# Patient Record
Sex: Male | Born: 1965 | Race: Black or African American | Hispanic: No | Marital: Single | Smoking: Never smoker
Health system: Southern US, Community
[De-identification: ages and names within clinical notes are randomized; demographics above are authoritative.]

## PROBLEM LIST (undated history)

## (undated) DIAGNOSIS — I10 Essential (primary) hypertension: Secondary | ICD-10-CM

## (undated) DIAGNOSIS — F419 Anxiety disorder, unspecified: Secondary | ICD-10-CM

## (undated) HISTORY — DX: Essential (primary) hypertension: I10

## (undated) HISTORY — PX: WRIST SURGERY: SHX841

---

## 2014-10-20 ENCOUNTER — Encounter (HOSPITAL_COMMUNITY): Payer: Self-pay | Admitting: Emergency Medicine

## 2014-10-20 ENCOUNTER — Emergency Department (HOSPITAL_COMMUNITY)
Admission: EM | Admit: 2014-10-20 | Discharge: 2014-10-20 | Disposition: A | Discharge status: Home / Self Care | Payer: Self-pay | Attending: Emergency Medicine | Admitting: Emergency Medicine

## 2014-10-20 DIAGNOSIS — Z760 Encounter for issue of repeat prescription: Secondary | ICD-10-CM | POA: Insufficient documentation

## 2014-10-20 DIAGNOSIS — F419 Anxiety disorder, unspecified: Secondary | ICD-10-CM | POA: Insufficient documentation

## 2014-10-20 HISTORY — DX: Anxiety disorder, unspecified: F41.9

## 2014-10-20 MED ORDER — CLONAZEPAM 0.5 MG PO TABS: 0.5000 mg | ORAL_TABLET | Freq: Two times a day (BID) | ORAL | Status: DC | PRN

## 2014-10-20 NOTE — Discharge Instructions (Signed)
Panic Attacks Panic attacks are sudden, short feelings of great fear or discomfort. You may have them for no reason when you are relaxed, when you are uneasy (anxious), or when you are sleeping.  HOME CARE  Take all your medicines as told.  Check with your doctor before starting new medicines.  Keep all doctor visits. GET HELP IF:  You are not able to take your medicines as told.  Your symptoms do not get better.  Your symptoms get worse. GET HELP RIGHT AWAY IF:  Your attacks seem different than your normal attacks.  You have thoughts about hurting yourself or others.  You take panic attack medicine and you have a side effect. MAKE SURE YOU:  Understand these instructions.  Will watch your condition.  Will get help right away if you are not doing well or get worse. Document Released: 08/13/2010 Document Revised: 05/01/2013 Document Reviewed: 02/22/2013 [facility] Patient Information 2015 [place]. This information is not intended to replace advice given to you by your health care provider. Make sure you discuss any questions you have with your health care provider.  Social Anxiety Disorder Social anxiety disorder, previously called social phobia, is a mental disorder. People with social anxiety disorder frequently feel nervous, afraid, or embarrassed when around other people in social situations. They constantly worry that other people are judging or criticizing them for how they look, what they say, or how they act. They may worry that other people might reject them because of their appearance or behavior. Social anxiety disorder is more than just occasional shyness or self-consciousness. It can cause severe emotional distress. It can interfere with daily life activities. Social anxiety disorder also may lead to excessive alcohol or drug use and even suicide.  Social anxiety disorder is actually one of the most common mental disorders. It can develop at any time but usually  starts in the teenage years. Women are more commonly affected than men. Social anxiety disorder is also more common in people who have family members with anxiety disorders. It also is more common in people who have physical deformities or conditions with characteristics that are obvious to others, such as stuttered speech or movement abnormalities (Parkinson disease).  SYMPTOMS  In addition to feeling anxious or fearful in social situations, people with social anxiety disorder frequently have physical symptoms. Examples include:  Red face (blushing).  Racing heart.  Sweating.  Shaky hands or voice.  Confusion.  Light-headedness.  Upset stomach and diarrhea. DIAGNOSIS  Social anxiety disorder is diagnosed through an assessment by your health care provider. Your health care provider will ask you questions about your mood, thoughts, and reactions in social situations. Your health care provider may ask you about your medical history and use of alcohol or drugs, including prescription medicines. Certain medical conditions and the use of certain substances, including caffeine, can cause symptoms similar to social anxiety disorder. Your health care provider may refer you to a mental health specialist for further evaluation or treatment. The criteria for diagnosis of social anxiety disorder are:  Marked fear or anxiety in one or more social situations in which you may be closely watched or studied by others. Examples of such situations include:  Interacting socially (having a conversation with others, going to a party, or meeting strangers).  Being observed (eating or drinking in public or being called on in class).  Performing in front of others (giving a speech).  The social situations of concern almost always cause fear or anxiety, not  just occasionally.  People with social anxiety disorder fear that they will be viewed negatively in a way that will be embarrassing, will lead to rejection,  or will offend others. This fear is out of proportion to the actual threat posed by the social situation.  Often the triggering social situations are avoided, or they are endured with intense fear or anxiety. The fear, anxiety, or avoidance is persistent and lasts for 6 months or longer.  The anxiety causes difficulty functioning in at least some parts of your daily life. TREATMENT  Several types of treatment are available for social anxiety disorder. These treatments are often used in combination and include:   Talk therapy. Group talk therapy allows you to see that you are not alone with these problems. Individual talk therapy helps you address your specific anxiety issues with a caring professional. The most effective forms of talk therapy for social anxiety disorder are cognitive-behavioral therapy and exposure therapy. Cognitive-behavioral therapy helps you to identify and change negative thoughts and beliefs that are at the root of the disorder. Exposure therapy allows you to gradually face the situations that you fear most.  Relaxation and coping techniques. These include deep breathing, self-talk, meditation, visual imagery, and yoga. Relaxation techniques help to keep you calm in social situations.  Social Optician, dispensing.Social skills can be learned on your own or with the help of a talk therapist. They can help you feel more confident and comfortable in social situations.  Medicine. For anxiety limited to performance situations (performance anxiety), medicine called beta blockers can help by reducing or preventing the physical symptoms of social anxiety disorder. For more persistent and generalized social anxiety, antidepressant medicine may be prescribed to help control symptoms. In severe cases of social anxiety disorder, strong antianxiety medicine, called benzodiazepines, may be prescribed on a limited basis and for a short time. Document Released: 06/09/2005 Document Revised:  11/25/2013 Document Reviewed: 10/09/2012 [facility] Patient Information 2015 [place], [place]. This information is not intended to replace advice given to you by your health care provider. Make sure you discuss any questions you have with your health care provider.

## 2014-10-20 NOTE — ED Notes (Addendum)
Pt states he has been out of medication clonazepam x 3 days and needs a refill, [facility], needs resources for PCP. Pt states he has anxiety issues.

## 2014-10-20 NOTE — ED Provider Notes (Signed)
CSN: [id number]     Arrival date & time 10/20/14  1207 History  This chart was scribed for Junius Finner, PA-C, working with Bethann Berkshire, MD by Jolene Provost, ED Scribe. This patient was seen in room WTR6/WTR6 and the patient's care was started at 12:57 PM.     Chief Complaint  Patient presents with  . Anxiety  . Medication Refill   Patient is a 49 y.o. male presenting with anxiety. The history is provided by the patient. No language interpreter was used.  Anxiety Pertinent negatives include no chest pain and no shortness of breath.   HPI Comments: Hayden Morris is a 49 y.o. male who presents to the Emergency Department complaining of anxiety. Pt states he has been out of his clonazepam medication for the last three days and has felt jittery, and moody and he believes it is because he has been off his medication. Pt states he has been on clonazepam for multiple years. Pt states he moved from [place] to [place] about 1 week ago and does not have a PCP. Pt denies a past hx of DM. Pt states he has been on blood pressure medication in the past but was taken off medication. Denies chest pain or SOB. Denies abdominal pain, n/v/d.    Past Medical History  Diagnosis Date  . Anxiety    Past Surgical History  Procedure Laterality Date  . Wrist surgery Left    No family history on file. History  Substance Use Topics  . Smoking status: Never Smoker   . Smokeless tobacco: Not on file  . Alcohol Use: No    Review of Systems  Constitutional: Negative for fever and chills.  Respiratory: Negative for shortness of breath.   Cardiovascular: Negative for chest pain and palpitations.  Gastrointestinal: Negative for nausea and vomiting.  Psychiatric/Behavioral: Negative for suicidal ideas and self-injury. The patient is nervous/anxious.     Allergies  Review of patient's allergies indicates no known allergies.  Home Medications   Prior to Admission medications   Medication Sig  Start Date End Date Taking? Authorizing Provider  clonazePAM (KLONOPIN) 0.5 MG tablet Take 1 tablet (0.5 mg total) by mouth 2 (two) times daily as needed for anxiety. 10/20/14   Junius Finner, PA-C   BP 140/105 mmHg  Pulse 82  Temp(Src) 98.7 F (37.1 C) (Oral)  Resp 18  SpO2 97% Physical Exam  Constitutional: He is oriented to person, place, and time. He appears well-developed and well-nourished. No distress.  HENT:  Head: Normocephalic and atraumatic.  Eyes: Pupils are equal, round, and reactive to light.  Neck: Neck supple.  Cardiovascular: Normal rate.   Pulmonary/Chest: Effort normal. No respiratory distress.  Musculoskeletal: Normal range of motion.  Neurological: He is alert and oriented to person, place, and time. Coordination normal.  Skin: Skin is warm and dry. He is not diaphoretic.  Psychiatric: He has a normal mood and affect. His behavior is normal.  Nursing note and vitals reviewed.   ED Course  Procedures  DIAGNOSTIC STUDIES: Oxygen Saturation is 97% on RA, normal by my interpretation.    COORDINATION OF CARE: 1:02 PM Discussed treatment plan with pt at bedside and pt agreed to plan.  Labs Review Labs Reviewed - No data to display  Imaging Review No results found.   EKG Interpretation None      MDM   Final diagnoses:  Anxiety  Medication refill    Pt presenting to ED with reports of anxiety, requesting medication refill. Pt  does have elevated BP but reports hx of HTN in the past.  Discussed establishing care with a PCP. [facility] as well as [facility]in [facility] info.  Refill for 2 weeks provided. Return precautions provided. Pt verbalized understanding and agreement with tx plan.   I personally performed the services described in this documentation, which was scribed in my presence. The recorded information has been reviewed and is accurate.   Junius Finnerrin O'Malley, PA-C 10/20/14 1311  Bethann BerkshireJoseph Zammit, MD 10/21/14 [phone]

## 2014-10-24 ENCOUNTER — Ambulatory Visit: Payer: Self-pay | Attending: Family Medicine | Admitting: Family Medicine

## 2014-10-24 VITALS — BP 145/99 | HR 94 | Temp 98.4°F | Resp 16 | Ht 74.0 in | Wt 190.0 lb

## 2014-10-24 DIAGNOSIS — I10 Essential (primary) hypertension: Secondary | ICD-10-CM | POA: Insufficient documentation

## 2014-10-24 DIAGNOSIS — F419 Anxiety disorder, unspecified: Secondary | ICD-10-CM | POA: Insufficient documentation

## 2014-10-24 DIAGNOSIS — Z139 Encounter for screening, unspecified: Secondary | ICD-10-CM

## 2014-10-24 LAB — CBC WITH DIFFERENTIAL/PLATELET
Basophils Absolute: 0.1 10*3/uL (ref 0.0–0.1)
Basophils Relative: 1 % (ref 0–1)
Eosinophils Absolute: 0.2 10*3/uL (ref 0.0–0.7)
Eosinophils Relative: 3 % (ref 0–5)
HCT: 44.9 % (ref 39.0–52.0)
Hemoglobin: 15.4 g/dL (ref 13.0–17.0)
LYMPHS ABS: 2 10*3/uL (ref 0.7–4.0)
LYMPHS PCT: 33 % (ref 12–46)
MCH: 30 pg (ref 26.0–34.0)
MCHC: 34.3 g/dL (ref 30.0–36.0)
MCV: 87.4 fL (ref 78.0–100.0)
MONO ABS: 0.6 10*3/uL (ref 0.1–1.0)
MONOS PCT: 10 % (ref 3–12)
MPV: 10.2 fL (ref 8.6–12.4)
NEUTROS ABS: 3.2 10*3/uL (ref 1.7–7.7)
NEUTROS PCT: 53 % (ref 43–77)
Platelets: 309 10*3/uL (ref 150–400)
RBC: 5.14 MIL/uL (ref 4.22–5.81)
RDW: 13.9 % (ref 11.5–15.5)
WBC: 6.1 10*3/uL (ref 4.0–10.5)

## 2014-10-24 LAB — COMPLETE METABOLIC PANEL WITH GFR
ALK PHOS: 84 U/L (ref 39–117)
ALT: 18 U/L (ref 0–53)
AST: 17 U/L (ref 0–37)
Albumin: 4.3 g/dL (ref 3.5–5.2)
BUN: 11 mg/dL (ref 6–23)
CO2: 30 meq/L (ref 19–32)
CREATININE: 1.07 mg/dL (ref 0.50–1.35)
Calcium: 10.1 mg/dL (ref 8.4–10.5)
Chloride: 105 mEq/L (ref 96–112)
GFR, Est Non African American: 81 mL/min
GLUCOSE: 85 mg/dL (ref 70–99)
Potassium: 4.6 mEq/L (ref 3.5–5.3)
SODIUM: 142 meq/L (ref 135–145)
Total Bilirubin: 0.4 mg/dL (ref 0.2–1.2)
Total Protein: 7.9 g/dL (ref 6.0–8.3)

## 2014-10-24 MED ORDER — LISINOPRIL 20 MG PO TABS: 20.0000 mg | ORAL_TABLET | Freq: Every day | ORAL | Status: DC

## 2014-10-24 MED ORDER — CLONAZEPAM 0.5 MG PO TABS: 0.5000 mg | ORAL_TABLET | Freq: Two times a day (BID) | ORAL | Status: DC | PRN

## 2014-10-24 NOTE — Progress Notes (Signed)
Patient ID: Hayden LynchJonathan Morris, male   DOB: 1965-09-10, 49 y.o.   MRN: [id number]  CC:    ED follow-up for refill of Klonipin .5 mg. He has a history of anxiety and panic attacks and has been on Klonipin for a number of years.  He also has a history of HTN but has not been on medication for a couple of years as his blood presssure was better.  He denies other past medical history but will arrange for us to get his medical records [place]  He was seen in ED a couple of days ago and was given a 10 day supply of Klonipin and directed to come here for ongoing health care.   ROS:  GEN:   Denies fever, chills,WT loss Skin:   Denies lesions or rashes HENT:   Denies  earache, epistaxis, sore throat, or    Admits: neck pain, headaches                LUNGS:  Denies SOB with rest or walking  (only with anxiery attacks.) CV:   Denies CP or palpitations ABD:   Denies abdominal pain, nausea,and vomiting            EXT:    Denies muscle spasms or swelling; no pain in lower ext, no weakness NEURO:   Denies numbness or tingling, denies seizures  Objective:  Filed Vitals:   10/24/14 0942  BP: 145/99  Pulse: 94  Temp: 98.4 F (36.9 C)  TempSrc: Oral  Resp: 16  Height: 6\' 2"  (1.88 m)  Weight: 190 lb (86.183 kg)  SpO2: 96%    Physical Exam:  General:  in no acute distress. HEENT:  Normocephalic, atraumatic, no pallor, no icterus, moist oral mucosa,  Neck:   Supple FROM w/o adenopathy, tenderness, or thyroidomegaly. Heart:   Normal  s1 &s2  Regular rate and rhythm, without M,G,R Lungs:   Clear to auscultation bilaterally. No increase in respiratory effort. Abdomen:  Soft, nontender, nondistended, positive bowel sounds. Exetremeties:  No pedal edema. Neuro:   Alert, awake, oriented x3, nonfocal.  Pertinent Lab Results:   Medications: Prior to Admission medications   Medication Sig Start Date End Date Taking? Authorizing Provider  clonazePAM (KLONOPIN) 0.5 MG tablet  Take 1 tablet (0.5 mg total) by mouth 2 (two) times daily as needed for anxiety. 10/20/14  Yes Junius FinnerErin O'Malley, PA-C    Assessment: 1. Anxiety 2. Hypertension 3. Need for PPD  Plan: 1. Refill of Klonipin  And follow-up with assigned PCP here in two weeks. 2. Start lisinopril 20 mg qd and do basic blood work. 3. PPD.    Follow up:  The patient was given clear instructions to go to ER or return to medical center if symptoms don't improve, worsen or new problems develop. The patient verbalized understanding.   This note has been created with Education officer, environmentalDragon speech recognition software and smart phrase technology. Any transcriptional errors are unintentional.   Henrietta HooverLinda C. Bernhardt, FNP,BC 10/24/2014, 9:46 AM

## 2014-10-24 NOTE — Progress Notes (Signed)
Pt comes in to establish care for Hx. Anxiety [facility]need [facility], med refill [facility] prescribed Klonopin 0.5 mg tab and instructed to come here to establish care VSS States he has 10 pills left

## 2014-10-24 NOTE — Patient Instructions (Signed)
1.  Start lisinopril for blood pressure 2.  Make an appointment with our financial people to determine what help is available. 3.  Return early Monday morning at 9:00 to have TB skin test read. 4.  Make an appointment with assigned primary doctor here for two weeks. 5.  Follow Dash diet to help control salt. DASH Eating Plan DASH stands for "Dietary Approaches to Stop Hypertension." The DASH eating plan is a healthy eating plan that has been shown to reduce high blood pressure (hypertension). Additional health benefits may include reducing the risk of type 2 diabetes mellitus, heart disease, and stroke. The DASH eating plan may also help with weight loss. WHAT DO I NEED TO KNOW ABOUT THE DASH EATING PLAN? For the DASH eating plan, you will follow these general guidelines:  Choose foods with a percent daily value for sodium of less than 5% (as listed on the food label).  Use salt-free seasonings or herbs instead of table salt or sea salt.  Check with your health care provider or pharmacist before using salt substitutes.  Eat lower-sodium products, often labeled as "lower sodium" or "no salt added."  Eat fresh foods.  Eat more vegetables, fruits, and low-fat dairy products.  Choose whole grains. Look for the word "whole" as the first word in the ingredient list.  Choose fish and skinless chicken or [place] more often than red meat. Limit fish, poultry, and meat to 6 oz (170 g) each day.  Limit sweets, desserts, sugars, and sugary drinks.  Choose heart-healthy fats.  Limit cheese to 1 oz (28 g) per day.  Eat more home-cooked food and less restaurant, buffet, and fast food.  Limit fried foods.  Cook foods using methods other than frying.  Limit canned vegetables. If you do use them, rinse them well to decrease the sodium.  When eating at a restaurant, ask that your food be prepared with less salt, or no salt if possible. WHAT FOODS CAN I EAT? Seek help from a dietitian for  individual calorie needs. Grains Whole grain or whole wheat bread. Brown rice. Whole grain or whole wheat pasta. Quinoa, bulgur, and whole grain cereals. Low-sodium cereals. Corn or whole wheat flour tortillas. Whole grain cornbread. Whole grain crackers. Low-sodium crackers. Vegetables Fresh or frozen vegetables (raw, steamed, roasted, or grilled). Low-sodium or reduced-sodium tomato and vegetable juices. Low-sodium or reduced-sodium tomato sauce and paste. Low-sodium or reduced-sodium canned vegetables.  Fruits All fresh, canned (in natural juice), or frozen fruits. Meat and Other Protein Products Ground beef (85% or leaner), grass-fed beef, or beef trimmed of fat. Skinless chicken or [place]. Ground chicken or [place]. Pork trimmed of fat. All fish and seafood. Eggs. Dried beans, peas, or lentils. Unsalted nuts and seeds. Unsalted canned beans. Dairy Low-fat dairy products, such as skim or 1% milk, 2% or reduced-fat cheeses, low-fat ricotta or cottage cheese, or plain low-fat yogurt. Low-sodium or reduced-sodium cheeses. Fats and Oils Tub margarines without trans fats. Light or reduced-fat mayonnaise and salad dressings (reduced sodium). Avocado. Safflower, olive, or canola oils. Natural peanut or almond butter. Other Unsalted popcorn and pretzels. The items listed above may not be a complete list of recommended foods or beverages. Contact your dietitian for more options. WHAT FOODS ARE NOT RECOMMENDED? Grains White bread. White pasta. White rice. Refined cornbread. Bagels and croissants. Crackers that contain trans fat. Vegetables Creamed or fried vegetables. Vegetables in a cheese sauce. Regular canned vegetables. Regular canned tomato sauce and paste. Regular tomato and vegetable juices. Fruits Dried  fruits. Canned fruit in light or heavy syrup. Fruit juice. Meat and Other Protein Products Fatty cuts of meat. Ribs, chicken wings, bacon, sausage, bologna, salami, chitterlings, fatback, hot  dogs, bratwurst, and packaged luncheon meats. Salted nuts and seeds. Canned beans with salt. Dairy Whole or 2% milk, cream, half-and-half, and cream cheese. Whole-fat or sweetened yogurt. Full-fat cheeses or blue cheese. Nondairy creamers and whipped toppings. Processed cheese, cheese spreads, or cheese curds. Condiments Onion and garlic salt, seasoned salt, table salt, and sea salt. Canned and packaged gravies. Worcestershire sauce. Tartar sauce. Barbecue sauce. Teriyaki sauce. Soy sauce, including reduced sodium. Steak sauce. Fish sauce. Oyster sauce. Cocktail sauce. Horseradish. Ketchup and mustard. Meat flavorings and tenderizers. Bouillon cubes. Hot sauce. Tabasco sauce. Marinades. Taco seasonings. Relishes. Fats and Oils Butter, stick margarine, lard, shortening, ghee, and bacon fat. Coconut, palm kernel, or palm oils. Regular salad dressings. Other Pickles and olives. Salted popcorn and pretzels. The items listed above may not be a complete list of foods and beverages to avoid. Contact your dietitian for more information. WHERE CAN I FIND MORE INFORMATION? National Heart, Lung, and Blood Institute: [employer] Document Released: 06/30/2011 Document Revised: 11/25/2013 Document Reviewed: 05/15/2013 [facility] Patient Information 2015 [place], [place]. This information is not intended to replace advice given to you by your health care provider. Make sure you discuss any questions you have with your health care provider.

## 2014-10-27 ENCOUNTER — Telehealth: Payer: Self-pay | Admitting: *Deleted

## 2014-10-27 ENCOUNTER — Ambulatory Visit: Payer: Self-pay | Attending: Internal Medicine

## 2014-10-27 ENCOUNTER — Other Ambulatory Visit: Payer: Self-pay | Admitting: Family Medicine

## 2014-10-27 DIAGNOSIS — Z0000 Encounter for general adult medical examination without abnormal findings: Secondary | ICD-10-CM

## 2014-10-27 MED ORDER — AMLODIPINE BESYLATE 10 MG PO TABS: 10.0000 mg | ORAL_TABLET | Freq: Every day | ORAL | Status: DC

## 2014-10-27 NOTE — Progress Notes (Unsigned)
PT IS HERE FOR A PPD TEST.

## 2014-10-27 NOTE — Telephone Encounter (Signed)
Gave patient results that his labs were normal-patient appreciative and had no questions

## 2014-10-27 NOTE — Telephone Encounter (Signed)
Pt is aware of his lab results. 

## 2014-10-29 ENCOUNTER — Ambulatory Visit: Payer: Self-pay | Attending: Family Medicine

## 2014-10-29 LAB — TB SKIN TEST
Induration: 0 mm
TB SKIN TEST: NEGATIVE

## 2014-10-29 NOTE — Progress Notes (Unsigned)
Pt is here for a PPD reading. Test is negative.

## 2014-11-06 ENCOUNTER — Telehealth: Payer: Self-pay | Admitting: Internal Medicine

## 2014-11-06 NOTE — Telephone Encounter (Signed)
Pt will be coming in to fill out of medical records request form to be sent to [facility].  Their fax is currently down, please email MR release request to [email or web address] Attn: Medical Records.

## 2014-11-07 ENCOUNTER — Encounter: Payer: Self-pay | Admitting: Internal Medicine

## 2014-11-07 ENCOUNTER — Ambulatory Visit: Payer: Self-pay | Attending: Internal Medicine | Admitting: Internal Medicine

## 2014-11-07 VITALS — BP 122/82 | HR 68 | Temp 97.6°F | Resp 16 | Ht 74.0 in | Wt 193.0 lb

## 2014-11-07 DIAGNOSIS — I10 Essential (primary) hypertension: Secondary | ICD-10-CM | POA: Insufficient documentation

## 2014-11-07 DIAGNOSIS — F419 Anxiety disorder, unspecified: Secondary | ICD-10-CM | POA: Insufficient documentation

## 2014-11-07 MED ORDER — VALSARTAN 80 MG PO TABS: 80.0000 mg | ORAL_TABLET | Freq: Every day | ORAL | Status: AC

## 2014-11-07 NOTE — Progress Notes (Signed)
Patient ID: Hayden Morris, male   DOB: Mar 09, 1966, 49 y.o.   MRN: [id number]  BJY:[id number]  YQM:[id number]  DOB - [birth year]  CC:  Chief Complaint  Patient presents with  . Follow-up       HPI: Hayden Morris is a 49 y.o. male here today to establish medical care.  Patient presents to clinic today with a history of hypertension that was diagnosed recently. He was seen by Hayden Living, NP two weeks ago and was started on Lisinopril but reports that the medication gave him headaches and SOB. A few days later he reports that he was switched to Amlodipine. He now reports that everyday he takes the amlodipine he gets nauseous. He states that he takes it early in the morning with a meal. He reports that in the past he was on Diovan and he did very well with that medication. He would like to be switched to Diovan.  Patient has No headache, No chest pain, No abdominal pain - No Nausea, No new weakness tingling or numbness, No Cough - SOB.  No Known Allergies Past Medical History  Diagnosis Date  . Anxiety   . Hypertension    Current Outpatient Prescriptions on File Prior to Visit  Medication Sig Dispense Refill  . amLODipine (NORVASC) 10 MG tablet Take 1 tablet (10 mg total) by mouth daily. 90 tablet 3  . clonazePAM (KLONOPIN) 0.5 MG tablet Take 1 tablet (0.5 mg total) by mouth 2 (two) times daily as needed for anxiety. 28 tablet 0   No current facility-administered medications on file prior to visit.   History reviewed. No pertinent family history. History   Social History  . Marital Status: Single    Spouse Name: N/A  . Number of Children: N/A  . Years of Education: N/A   Occupational History  . Not on file.   Social History Main Topics  . Smoking status: Never Smoker   . Smokeless tobacco: Not on file  . Alcohol Use: No  . Drug Use: Not on file  . Sexual Activity: Not on file   Other Topics Concern  . Not on file   Social History Narrative    Review of  Systems: Constitutional: Negative for fever, chills, diaphoresis, activity change, appetite change and fatigue. HENT: Negative for ear pain, nosebleeds, congestion, facial swelling, rhinorrhea, neck pain, neck stiffness and ear discharge.  Eyes: Negative for pain, discharge, redness, itching and visual disturbance. Respiratory: Negative for cough, choking, chest tightness, shortness of breath, wheezing and stridor.  Cardiovascular: Negative for chest pain, palpitations and leg swelling. Gastrointestinal: Negative for abdominal distention. Genitourinary: Negative for dysuria, urgency, frequency, hematuria, flank pain, decreased urine volume, difficulty urinating and dyspareunia.  Musculoskeletal: Negative for back pain, joint swelling, arthralgia and gait problem. Neurological: Negative for dizziness, tremors, seizures, syncope, facial asymmetry, speech difficulty, weakness, light-headedness, numbness and headaches.  Hematological: Negative for adenopathy. Does not bruise/bleed easily. Psychiatric/Behavioral: Negative for hallucinations, behavioral problems, confusion, dysphoric mood, decreased concentration and agitation. + anxiety   Objective:   Filed Vitals:   11/07/14 1033  BP: 127/91  Pulse: 68  Temp: 97.6 F (36.4 C)  Resp: 16    Physical Exam  Constitutional: He is oriented to person, place, and time.  Cardiovascular: Normal rate, regular rhythm and normal heart sounds.   Pulmonary/Chest: Effort normal and breath sounds normal.  Musculoskeletal: He exhibits no edema.  Neurological: He is alert and oriented to person, place, and time.  Skin: Skin is warm and dry.  Lab Results  Component Value Date   WBC 6.1 10/24/2014   HGB 15.4 10/24/2014   HCT 44.9 10/24/2014   MCV 87.4 10/24/2014   PLT 309 10/24/2014   Lab Results  Component Value Date   CREATININE 1.07 10/24/2014   BUN 11 10/24/2014   NA 142 10/24/2014   K 4.6 10/24/2014   CL 105 10/24/2014   CO2 30  10/24/2014    No results found for: HGBA1C Lipid Panel  No results found for: CHOL, TRIG, HDL, CHOLHDL, VLDL, LDLCALC     Assessment and plan:   Hayden Morris was seen today for follow-up.  Diagnoses and all orders for this visit:  Essential hypertension Orders: -     valsartan (DIOVAN) 80 MG tablet; Take 1 tablet (80 mg total) by mouth daily. Will discontinue patient off amlodipine and switch to valsartan. Will bring patient back in 2 weeks to recheck pressure.   Anxiety disorder, unspecified anxiety disorder type Patient reports that he has been on Klonopin since 2009 when he had a close family member to pass away. Patient was just given a refill of Klonopin 2 weeks ago. Have explained to patient that I would not do a cyclic refill of Klonopin, but I will be willing to start him on a SSRI. Patient declines SSRI, I have given him resources to [facility] for further anxiety management.  Return in about 2 weeks (around 11/21/2014) for Nurse Visit-Bp check and 3 mo PCP.      Hayden Morris, [facility] Morris [facility] [phone] 11/07/2014, 11:09 AM

## 2014-11-07 NOTE — Patient Instructions (Signed)
We have switched your blood pressure medication back to diovan/valsartan. You will stop amlodipine. Please come back in 2 weeks for a nurse to check your BP

## 2014-11-07 NOTE — Progress Notes (Signed)
Pt is here following up on his HTN. Pt states that now he is on this BP medication he has been having nausea and pain in his neck.

## 2014-11-09 DIAGNOSIS — I10 Essential (primary) hypertension: Secondary | ICD-10-CM | POA: Insufficient documentation

## 2014-11-10 ENCOUNTER — Telehealth: Payer: Self-pay | Admitting: Internal Medicine

## 2014-11-10 ENCOUNTER — Telehealth: Payer: Self-pay | Admitting: *Deleted

## 2014-11-10 NOTE — Telephone Encounter (Signed)
Explained to patient that PCP would not refill klonopin and that he would need to be seen either at [facility] or [facility].  Contact information given to patient and patient appreciative.

## 2014-11-10 NOTE — Telephone Encounter (Signed)
I have explained to patient that I will not refill Klonopin. [facility].

## 2014-11-10 NOTE — Telephone Encounter (Signed)
Pt called requesting medication refill for clonazePAM (KLONOPIN) 0.5 MG tablet. Patient states he still has medication but does not want to be out of it. Please f/u

## 2014-11-10 NOTE — Telephone Encounter (Signed)
Patient called to request a med refill for clonazePAM (KLONOPIN) 0.5 MG tablet . Please f/u with pt. °

## 2014-11-24 ENCOUNTER — Ambulatory Visit: Payer: Self-pay

## 2014-11-26 ENCOUNTER — Telehealth: Payer: Self-pay | Admitting: *Deleted

## 2014-11-26 NOTE — Telephone Encounter (Signed)
Patient called in to say the Diovan he was prescribed is causing headaches.  He says that Ms. Hayden Morris told him to stop taking it and restart his amlodipine if he had side effects.  I told patient he was supposed to be coming in for a RN visit to check his blood pressure and he said he had been to busy at work to make the appointment.  Will route to PCP

## 2014-11-27 NOTE — Telephone Encounter (Signed)
Needs to come for nurse visit first. Every medication he has been on he has had problems with. I think he should stick with diovan if pressure is normal

## 2015-01-14 ENCOUNTER — Emergency Department (HOSPITAL_COMMUNITY): Payer: Self-pay

## 2015-01-14 ENCOUNTER — Encounter (HOSPITAL_COMMUNITY): Payer: Self-pay | Admitting: Emergency Medicine

## 2015-01-14 ENCOUNTER — Emergency Department (HOSPITAL_COMMUNITY)
Admission: EM | Admit: 2015-01-14 | Discharge: 2015-01-14 | Disposition: A | Discharge status: Home / Self Care | Payer: Self-pay | Attending: Emergency Medicine | Admitting: Emergency Medicine

## 2015-01-14 DIAGNOSIS — F419 Anxiety disorder, unspecified: Secondary | ICD-10-CM | POA: Insufficient documentation

## 2015-01-14 DIAGNOSIS — W11XXXA Fall on and from ladder, initial encounter: Secondary | ICD-10-CM | POA: Insufficient documentation

## 2015-01-14 DIAGNOSIS — Y998 Other external cause status: Secondary | ICD-10-CM | POA: Insufficient documentation

## 2015-01-14 DIAGNOSIS — Z79899 Other long term (current) drug therapy: Secondary | ICD-10-CM | POA: Insufficient documentation

## 2015-01-14 DIAGNOSIS — Y9389 Activity, other specified: Secondary | ICD-10-CM | POA: Insufficient documentation

## 2015-01-14 DIAGNOSIS — S62316A Displaced fracture of base of fifth metacarpal bone, right hand, initial encounter for closed fracture: Secondary | ICD-10-CM | POA: Insufficient documentation

## 2015-01-14 DIAGNOSIS — I10 Essential (primary) hypertension: Secondary | ICD-10-CM | POA: Insufficient documentation

## 2015-01-14 DIAGNOSIS — Y9289 Other specified places as the place of occurrence of the external cause: Secondary | ICD-10-CM | POA: Insufficient documentation

## 2015-01-14 DIAGNOSIS — S62306A Unspecified fracture of fifth metacarpal bone, right hand, initial encounter for closed fracture: Secondary | ICD-10-CM

## 2015-01-14 MED ORDER — OXYCODONE-ACETAMINOPHEN 5-325 MG PO TABS: 1.0000 | ORAL_TABLET | Freq: Four times a day (QID) | ORAL | Status: AC | PRN

## 2015-01-14 MED ORDER — IBUPROFEN 600 MG PO TABS: 600.0000 mg | ORAL_TABLET | Freq: Four times a day (QID) | ORAL | Status: DC | PRN

## 2015-01-14 MED ORDER — OXYCODONE-ACETAMINOPHEN 5-325 MG PO TABS
1.0000 | ORAL_TABLET | Freq: Once | ORAL | Status: AC
  Administered 2015-01-14: 1 via ORAL
  Filled 2015-01-14: qty 1

## 2015-01-14 NOTE — ED Provider Notes (Signed)
CSN: [id number]     Arrival date & time 01/14/15  1800 History  This chart was scribed for Hayden Finner, PA-C, working with Pricilla Loveless, MD by Chestine Spore, ED Scribe. The patient was seen in room WTR9/WTR9 at 6:18 PM.    Chief Complaint  Patient presents with  . Hand Pain      The history is provided by the patient.    HPI Comments: Hayden Morris is a 49 y.o. male with a medical hx of HTN who presents to the Emergency Department complaining of right hand pain onset 2:00 PM today. He reports that he was trying to catch his fall from a ladder and he landed on his hand. He notes that he originally had pain in his right wrist before the incident that worsened it. He rates his pain as 10/10 and that the swelling increased throughout the day. He states that he is having associated symptoms of right hand joint swelling. He states that he has tried aleve with no relief for his symptoms. He denies hitting his head and any other symptoms.  Pt is Right hand dominant.    Past Medical History  Diagnosis Date  . Anxiety   . Hypertension    Past Surgical History  Procedure Laterality Date  . Wrist surgery Left    No family history on file. History  Substance Use Topics  . Smoking status: Never Smoker   . Smokeless tobacco: Not on file  . Alcohol Use: No    Review of Systems  Musculoskeletal: Positive for joint swelling.  Skin: Negative for color change and wound.      Allergies  Review of patient's allergies indicates no known allergies.  Home Medications   Prior to Admission medications   Medication Sig Start Date End Date Taking? Authorizing Provider  amLODipine (NORVASC) 5 MG tablet Take 5 mg by mouth daily.   Yes Historical Provider, MD  clonazePAM (KLONOPIN) 0.5 MG tablet Take 1 tablet (0.5 mg total) by mouth 2 (two) times daily as needed for anxiety. 10/24/14  Yes Henrietta Hoover, NP  Multiple Vitamin (MULTIVITAMIN WITH MINERALS) TABS tablet Take 1 tablet by  mouth daily.   Yes Historical Provider, MD  ibuprofen (ADVIL,MOTRIN) 600 MG tablet Take 1 tablet (600 mg total) by mouth every 6 (six) hours as needed. 01/14/15   Hayden Finner, PA-C  oxyCODONE-acetaminophen (PERCOCET/ROXICET) 5-325 MG per tablet Take 1-2 tablets by mouth every 6 (six) hours as needed for severe pain. 01/14/15   Hayden Finner, PA-C  valsartan (DIOVAN) 80 MG tablet Take 1 tablet (80 mg total) by mouth daily. Patient not taking: Reported on 01/14/2015 11/07/14   Ambrose Finland, NP   BP 114/81 mmHg  Pulse 60  Temp(Src) 98.4 F (36.9 C) (Oral)  Resp 19  Ht  (1.854 m)  Wt 205 lb (92.987 kg)  BMI 27.05 kg/m2  SpO2 93% Physical Exam  Constitutional: He is oriented to person, place, and time. He appears well-developed and well-nourished. No distress.  HENT:  Head: Normocephalic and atraumatic.  Eyes: EOM are normal.  Neck: Neck supple. No tracheal deviation present.  Cardiovascular: Normal rate.   Pulses:      Radial pulses are 2+ on the right side.  Pulmonary/Chest: Effort normal. No respiratory distress.  Musculoskeletal: Normal range of motion.       Right wrist: He exhibits tenderness. He exhibits normal range of motion and no swelling.       Right hand: He exhibits tenderness  and swelling. He exhibits normal range of motion. Normal sensation noted.  Right hand: moderate edema to dorsal aspect over 3rd, 4th, 5th metacarpals with associated tenderness. FROM all five fingers with increased pain creating a full fist. 4/5 grip strength compared to left hand.  Right wrist: no edema and FROM. Mild tenderness to dorsal aspect. Cap refill less thatn 3. Sensation intact.  Neurological: He is alert and oriented to person, place, and time.  Skin: Skin is warm and dry.  Psychiatric: He has a normal mood and affect. His behavior is normal.  Nursing note and vitals reviewed.   ED Course  Procedures (including critical care time) DIAGNOSTIC STUDIES: Oxygen Saturation is 93% on  RA, low by my interpretation.    COORDINATION OF CARE: 6:20 PM-Discussed treatment plan which includes right hand x-ray with pt at bedside and pt agreed to plan.   Labs Review Labs Reviewed - No data to display  Imaging Review Dg Wrist Complete Right  01/14/2015   CLINICAL DATA:  Fall from ladder, landed on right hand  EXAM: RIGHT WRIST - COMPLETE 3+ VIEW  COMPARISON:  None.  FINDINGS: Four views of the right wrist submitted. There is minimal displaced oblique fracture proximal aspect of fifth metacarpal.  IMPRESSION: Minimal displaced oblique fracture proximal fifth metacarpal.   Electronically Signed   By: Natasha Mead M.D.   On: 01/14/2015 19:07   Dg Hand Complete Right  01/14/2015   CLINICAL DATA:  Pain after falling from ladder  EXAM: RIGHT HAND - COMPLETE 3+ VIEW  COMPARISON:  None.  FINDINGS: Frontal, oblique, and lateral views obtained. There is an obliquely oriented fracture of the proximal fifth metacarpal in near anatomic alignment. No other fracture. No dislocation. 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 No erosive changes.  IMPRESSION: Fracture proximal fifth metacarpal with alignment near anatomic. No other fractures. No dislocations. 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   Electronically Signed   By: Bretta Bang III M.D.   On: 01/14/2015 19:07     EKG Interpretation None      MDM   Final diagnoses:  Fracture of fifth metacarpal bone of right hand, closed, initial encounter    Right hand swelling and pain after fall. Right hand is neurovascularly in tact. Plain films: significant for fracture proximal fifth metacarpal with alignment near anatomic.  No other fractures.  Will place pt in ulnar gutter splint and provide sling for comfort. Advised to call to schedule f/u appointment with Dr. Mina Marble, hand surgery. Home care instructions provided. Rx: percocet and ibuprofen. Return precautions provided. Pt verbalized  understanding and agreement with tx plan.   I personally performed the services described in this documentation, which was scribed in my presence. The recorded information has been reviewed and is accurate.   Hayden Finner, PA-C 01/14/15 1951  Pricilla Loveless, MD 01/16/15 1045

## 2015-01-14 NOTE — ED Notes (Signed)
Pt complaint of right hand pain as result of catching fall. Radial pulse present; full ROM but swelling present.

## 2015-01-20 ENCOUNTER — Emergency Department (HOSPITAL_COMMUNITY)
Admission: EM | Admit: 2015-01-20 | Discharge: 2015-01-20 | Disposition: A | Discharge status: Home / Self Care | Payer: Self-pay | Attending: Emergency Medicine | Admitting: Emergency Medicine

## 2015-01-20 ENCOUNTER — Encounter (HOSPITAL_COMMUNITY): Payer: Self-pay | Admitting: *Deleted

## 2015-01-20 DIAGNOSIS — I10 Essential (primary) hypertension: Secondary | ICD-10-CM | POA: Insufficient documentation

## 2015-01-20 DIAGNOSIS — X58XXXD Exposure to other specified factors, subsequent encounter: Secondary | ICD-10-CM | POA: Insufficient documentation

## 2015-01-20 DIAGNOSIS — S62309D Unspecified fracture of unspecified metacarpal bone, subsequent encounter for fracture with routine healing: Secondary | ICD-10-CM

## 2015-01-20 DIAGNOSIS — S62602D Fracture of unspecified phalanx of right middle finger, subsequent encounter for fracture with routine healing: Secondary | ICD-10-CM | POA: Insufficient documentation

## 2015-01-20 DIAGNOSIS — Z79899 Other long term (current) drug therapy: Secondary | ICD-10-CM | POA: Insufficient documentation

## 2015-01-20 DIAGNOSIS — F419 Anxiety disorder, unspecified: Secondary | ICD-10-CM | POA: Insufficient documentation

## 2015-01-20 NOTE — ED Notes (Signed)
Ortho tech called to do splint

## 2015-01-20 NOTE — ED Notes (Signed)
Pt complains of tingling in his right hand for the past 2 days. Pt broke his hand last week and had a cast put on. Pt states his hand felt normal before 2 days ago. Fingers are warm to touch, cap refill <2 sec. Pt can feel sensation on his fingers distal to injury.

## 2015-01-20 NOTE — Discharge Instructions (Signed)

## 2015-01-20 NOTE — ED Provider Notes (Signed)
CSN: [id number]     Arrival date & time 01/20/15  [id number] History  This chart was scribed for Jaynie Crumbleatyana Emmylou Bieker, PA-C, working with Lorre NickAnthony Allen, MD by Octavia HeirArianna Nassar, ED Scribe. This patient was seen in room WTR8/WTR8 and the patient's care was started at 7:33 PM.     Chief Complaint  Patient presents with  . Hand Tingling      The history is provided by the patient. No language interpreter was used.    HPI Comments: Hayden Morris is a 49 y.o. male who presents to the Emergency Department complaining of constant, right middle digit tingling onset 3 days ago. He has associated numbness. Pt broke his right hand last week and had a splint put on.  Pt suspects the splint might be too tight and it cutting into his finger. He notes his other fingers feel fine. Pt has a follow up with an orthopedic specialist in 2 weeks.   Past Medical History  Diagnosis Date  . Anxiety   . Hypertension    Past Surgical History  Procedure Laterality Date  . Wrist surgery Left    No family history on file. History  Substance Use Topics  . Smoking status: Never Smoker   . Smokeless tobacco: Not on file  . Alcohol Use: No    Review of Systems  Neurological:       Tingling  All other systems reviewed and are negative.     Allergies  Review of patient's allergies indicates no known allergies.  Home Medications   Prior to Admission medications   Medication Sig Start Date End Date Taking? Authorizing Provider  amLODipine (NORVASC) 5 MG tablet Take 5 mg by mouth daily.    Historical Provider, MD  clonazePAM (KLONOPIN) 0.5 MG tablet Take 1 tablet (0.5 mg total) by mouth 2 (two) times daily as needed for anxiety. 10/24/14   Henrietta HooverLinda C Bernhardt, NP  ibuprofen (ADVIL,MOTRIN) 600 MG tablet Take 1 tablet (600 mg total) by mouth every 6 (six) hours as needed. 01/14/15   Junius FinnerErin O'Malley, PA-C  Multiple Vitamin (MULTIVITAMIN WITH MINERALS) TABS tablet Take 1 tablet by mouth daily.    Historical Provider, MD   oxyCODONE-acetaminophen (PERCOCET/ROXICET) 5-325 MG per tablet Take 1-2 tablets by mouth every 6 (six) hours as needed for severe pain. 01/14/15   Junius FinnerErin O'Malley, PA-C  valsartan (DIOVAN) 80 MG tablet Take 1 tablet (80 mg total) by mouth daily. Patient not taking: Reported on 01/14/2015 11/07/14   Ambrose FinlandValerie A Keck, NP   Triage vitals: BP 127/86 mmHg  Pulse 83  Temp(Src) 98.6 F (37 C) (Oral)  Resp 18  SpO2 96% Physical Exam  Constitutional: He is oriented to person, place, and time. He appears well-developed and well-nourished. No distress.  HENT:  Head: Normocephalic.  Eyes: Conjunctivae are normal. Pupils are equal, round, and reactive to light. No scleral icterus.  Neck: Normal range of motion. Neck supple. No thyromegaly present.  Cardiovascular: Normal rate and regular rhythm.  Exam reveals no gallop and no friction rub.   No murmur heard. Pulmonary/Chest: Effort normal and breath sounds normal. No respiratory distress. He has no wheezes. He has no rales.  Musculoskeletal: Normal range of motion.  Right hand and thyroid splint. Patient is complaining of middle finger tingling. Finger appears to be normal color, cap refill less than 2 seconds. Normal sensation distally over the palmar and dorsal surface of all the fingers. Patient does have some bruising at the base of the middle finger from where  the splint is rubbing on it.  Neurological: He is alert and oriented to person, place, and time.  Skin: Skin is warm and dry. No rash noted.  Psychiatric: He has a normal mood and affect. His behavior is normal.  Nursing note and vitals reviewed.   ED Course  Procedures  DIAGNOSTIC STUDIES: Oxygen Saturation is 96% on RA, adequate by my interpretation.  COORDINATION OF CARE:  7:38 PM Discussed treatment plan which includes ortho tech to put on new splint and continue to follow up with orthopedist with pt at bedside and pt agreed to plan.  Labs Review Labs Reviewed - No data to  display  Imaging Review No results found.   EKG Interpretation None      MDM   Final diagnoses:  Boxer's fracture, with routine healing, subsequent encounter   Patient tingling to right middle finger, has been a splint for a week. Finger appears to be normal color and good cap refill however there is some irritation at the base from where the splint was too tight and digging into his skin. Splint was replaced. Patient feels much better. He has an appointment with Dr. Gilman Buttner in 2 weeks. We'll discharge home with follow-up.  Filed Vitals:   01/20/15 1855  BP: 127/86  Pulse: 83  Temp: 98.6 F (37 C)  TempSrc: Oral  Resp: 18  SpO2: 96%     I personally performed the services described in this documentation, which was scribed in my presence. The recorded information has been reviewed and is accurate.   Jaynie Crumble, PA-C 01/20/15 2025  Rolland Porter, MD 01/26/15 2046

## 2015-05-04 ENCOUNTER — Emergency Department (HOSPITAL_COMMUNITY)
Admission: EM | Admit: 2015-05-04 | Discharge: 2015-05-04 | Disposition: A | Discharge status: Home / Self Care | Payer: Self-pay | Attending: Emergency Medicine | Admitting: Emergency Medicine

## 2015-05-04 ENCOUNTER — Encounter (HOSPITAL_COMMUNITY): Payer: Self-pay

## 2015-05-04 DIAGNOSIS — M791 Myalgia, unspecified site: Secondary | ICD-10-CM

## 2015-05-04 DIAGNOSIS — Y998 Other external cause status: Secondary | ICD-10-CM | POA: Insufficient documentation

## 2015-05-04 DIAGNOSIS — Y9389 Activity, other specified: Secondary | ICD-10-CM | POA: Insufficient documentation

## 2015-05-04 DIAGNOSIS — Y92009 Unspecified place in unspecified non-institutional (private) residence as the place of occurrence of the external cause: Secondary | ICD-10-CM | POA: Insufficient documentation

## 2015-05-04 DIAGNOSIS — Z79899 Other long term (current) drug therapy: Secondary | ICD-10-CM | POA: Insufficient documentation

## 2015-05-04 DIAGNOSIS — I10 Essential (primary) hypertension: Secondary | ICD-10-CM | POA: Insufficient documentation

## 2015-05-04 DIAGNOSIS — F419 Anxiety disorder, unspecified: Secondary | ICD-10-CM | POA: Insufficient documentation

## 2015-05-04 DIAGNOSIS — W57XXXA Bitten or stung by nonvenomous insect and other nonvenomous arthropods, initial encounter: Secondary | ICD-10-CM | POA: Insufficient documentation

## 2015-05-04 DIAGNOSIS — S40861A Insect bite (nonvenomous) of right upper arm, initial encounter: Secondary | ICD-10-CM | POA: Insufficient documentation

## 2015-05-04 DIAGNOSIS — S80862A Insect bite (nonvenomous), left lower leg, initial encounter: Secondary | ICD-10-CM | POA: Insufficient documentation

## 2015-05-04 DIAGNOSIS — M542 Cervicalgia: Secondary | ICD-10-CM | POA: Insufficient documentation

## 2015-05-04 MED ORDER — IBUPROFEN 600 MG PO TABS: 600.0000 mg | ORAL_TABLET | Freq: Four times a day (QID) | ORAL | Status: DC | PRN

## 2015-05-04 MED ORDER — IBUPROFEN 200 MG PO TABS
600.0000 mg | ORAL_TABLET | Freq: Once | ORAL | Status: AC
  Administered 2015-05-04: 600 mg via ORAL
  Filled 2015-05-04: qty 3

## 2015-05-04 NOTE — ED Notes (Signed)
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

## 2015-05-04 NOTE — Discharge Instructions (Signed)
Musculoskeletal Pain Musculoskeletal pain is muscle and boney aches and pains. These pains can occur in any part of the body. Your caregiver may treat you without knowing the cause of the pain. They may treat you if blood or urine tests, X-rays, and other tests were normal.  CAUSES There is often not a definite cause or reason for these pains. These pains may be caused by a type of germ (virus). The discomfort may also come from overuse. Overuse includes working out too hard when your body is not fit. Boney aches also come from weather changes. Bone is sensitive to atmospheric pressure changes. HOME CARE INSTRUCTIONS   Ask when your test results will be ready. Make sure you get your test results.  Only take over-the-counter or prescription medicines for pain, discomfort, or fever as directed by your caregiver. If you were given medications for your condition, do not drive, operate machinery or power tools, or sign legal documents for 24 hours. Do not drink alcohol. Do not take sleeping pills or other medications that may interfere with treatment.  Continue all activities unless the activities cause more pain. When the pain lessens, slowly resume normal activities. Gradually increase the intensity and duration of the activities or exercise.  During periods of severe pain, bed rest may be helpful. Lay or sit in any position that is comfortable.  Putting ice on the injured area.  Put ice in a bag.  Place a towel between your skin and the bag.  Leave the ice on for 15 to 20 minutes, 3 to 4 times a day.  Follow up with your caregiver for continued problems and no reason can be found for the pain. If the pain becomes worse or does not go away, it may be necessary to repeat tests or do additional testing. Your caregiver may need to look further for a possible cause. SEEK IMMEDIATE MEDICAL CARE IF:  You have pain that is getting worse and is not relieved by medications.  You develop chest pain  that is associated with shortness or breath, sweating, feeling sick to your stomach (nauseous), or throw up (vomit).  Your pain becomes localized to the abdomen.  You develop any new symptoms that seem different or that concern you. MAKE SURE YOU:   Understand these instructions.  Will watch your condition.  Will get help right away if you are not doing well or get worse.   This information is not intended to replace advice given to you by your health care provider. Make sure you discuss any questions you have with your health care provider.   Document Released: 07/11/2005 Document Revised: 10/03/2011 Document Reviewed: 03/15/2013 Elsevier Interactive Patient Education 2016 [employer]. Try to avoid sleeping under the fan as this will aggravate muscle pain in your neck and back

## 2015-05-04 NOTE — ED Provider Notes (Signed)
CSN: [id number]     Arrival date & time 05/04/15  2057 History  By signing my name below, I, Hayden Morris, attest that this documentation has been prepared under the direction and in the presence of [name], NP. Electronically Signed: Ronney Morris, ED Scribe. 05/04/2015. 9:42 PM.    Chief Complaint  Patient presents with  . Insect Bite   The history is provided by the patient. No language interpreter was used.    HPI Comments: Hayden Morris is a 49 y.o. male with a history of anxiety and hypertension, who presents to the Emergency Department complaining of possible insect bites on his left leg and right arm. 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 He came here to get evaluated.   Patient also complains of pain in his neck. He also notes he has a sharp pain radiating down his left shoulder and back, all the way to his left hand whenever he turns his neck. Patient states he sleeps with a fan. Patient notes a history of anxiety, for which he has taken Klonopin in the past. He states he tried taking Klonopin again, but it did not alleviate his neck pain as expected.  Past Medical History  Diagnosis Date  . Anxiety   . Hypertension    Past Surgical History  Procedure Laterality Date  . Wrist surgery Left    History reviewed. No pertinent family history. Social History  Substance Use Topics  . Smoking status: Never Smoker   . Smokeless tobacco: None  . Alcohol Use: No    Review of Systems  Respiratory: Negative for shortness of breath.   Cardiovascular: Negative for chest pain.  Musculoskeletal: Positive for arthralgias and neck pain. Negative for neck stiffness.  Skin: Negative for rash and wound.  All other systems reviewed and are negative.  Allergies  Review of patient's allergies indicates no known allergies.  Home Medications   Prior to Admission medications   Medication Sig Start Date End Date Taking? Authorizing Provider  amLODipine  (NORVASC) 10 MG tablet Take 10 mg by mouth daily.   Yes Historical Provider, MD  clonazePAM (KLONOPIN) 0.5 MG tablet Take 1 tablet (0.5 mg total) by mouth 2 (two) times daily as needed for anxiety. 10/24/14  Yes Henrietta Hoover, NP  ibuprofen (ADVIL,MOTRIN) 600 MG tablet Take 1 tablet (600 mg total) by mouth every 6 (six) hours as needed. 05/04/15   [name], NP  oxyCODONE-acetaminophen (PERCOCET/ROXICET) 5-325 MG per tablet Take 1-2 tablets by mouth every 6 (six) hours as needed for severe pain. Patient not taking: Reported on 05/04/2015 01/14/15   Junius Finner, PA-C  valsartan (DIOVAN) 80 MG tablet Take 1 tablet (80 mg total) by mouth daily. Patient not taking: Reported on 01/14/2015 11/07/14   Ambrose Finland, NP   BP 131/90 mmHg  Pulse 83  Temp(Src) 98.1 F (36.7 C) (Oral)  Resp 20  SpO2 97% Physical Exam  Constitutional: He is oriented to person, place, and time. He appears well-developed and well-nourished. No distress.  HENT:  Head: Normocephalic and atraumatic.  Eyes: Conjunctivae and EOM are normal.  Neck: Normal range of motion. Neck supple. No spinous process tenderness and no muscular tenderness present. No tracheal deviation and normal range of motion present.  Cardiovascular: Normal rate.   Pulmonary/Chest: Effort normal. No respiratory distress.  Musculoskeletal: Normal range of motion. He exhibits no tenderness.  Neurological: He is alert and oriented to person, place, and time.  Skin: Skin  is warm and dry.  Psychiatric: He has a normal mood and affect. His behavior is normal.  Nursing note and vitals reviewed.   ED Course  Procedures (including critical care time)  DIAGNOSTIC STUDIES: Oxygen Saturation is 97% on RA, normal by my interpretation.    COORDINATION OF CARE: 9:35 PM - Discussed treatment plan with pt at bedside which includes anti-inflammatories for neck pain. Advised pt to stop sleeping with a fan. Advised pest control protocol for bedbugs, including  washing clothes/linens in hot water, and possibly fumigating home. Pt verbalized understanding and agreed to plan.   MDM   Final diagnoses:  Insect bite  Muscle ache    I personally performed the services described in this documentation, which was scribed in my presence. The recorded information has been reviewed and is accurate.   [name], NP 05/04/15 1610  Arby Barrette, MD 05/07/15 [phone]

## 2015-08-14 ENCOUNTER — Emergency Department (HOSPITAL_COMMUNITY)
Admission: EM | Admit: 2015-08-14 | Discharge: 2015-08-14 | Disposition: A | Discharge status: Home / Self Care | Payer: Self-pay | Attending: Emergency Medicine | Admitting: Emergency Medicine

## 2015-08-14 ENCOUNTER — Encounter (HOSPITAL_COMMUNITY): Payer: Self-pay | Admitting: *Deleted

## 2015-08-14 DIAGNOSIS — R600 Localized edema: Secondary | ICD-10-CM | POA: Insufficient documentation

## 2015-08-14 DIAGNOSIS — Z79899 Other long term (current) drug therapy: Secondary | ICD-10-CM | POA: Insufficient documentation

## 2015-08-14 DIAGNOSIS — F419 Anxiety disorder, unspecified: Secondary | ICD-10-CM | POA: Insufficient documentation

## 2015-08-14 DIAGNOSIS — M25512 Pain in left shoulder: Secondary | ICD-10-CM | POA: Insufficient documentation

## 2015-08-14 DIAGNOSIS — R609 Edema, unspecified: Secondary | ICD-10-CM

## 2015-08-14 DIAGNOSIS — I10 Essential (primary) hypertension: Secondary | ICD-10-CM | POA: Insufficient documentation

## 2015-08-14 LAB — BASIC METABOLIC PANEL
ANION GAP: 10 (ref 5–15)
BUN: 17 mg/dL (ref 6–20)
CHLORIDE: 109 mmol/L (ref 101–111)
CO2: 24 mmol/L (ref 22–32)
Calcium: 9.9 mg/dL (ref 8.9–10.3)
Creatinine, Ser: 1.36 mg/dL — ABNORMAL HIGH (ref 0.61–1.24)
GFR calc Af Amer: >60 mL/min (ref >60)
GFR, EST NON AFRICAN AMERICAN: 59 mL/min — AB (ref >60)
Glucose, Bld: 102 mg/dL — ABNORMAL HIGH (ref 65–99)
Potassium: 4.1 mmol/L (ref 3.5–5.1)
Sodium: 143 mmol/L (ref 135–145)

## 2015-08-14 LAB — I-STAT TROPONIN, ED: TROPONIN I, POC: 0.01 ng/mL (ref 0.00–0.08)

## 2015-08-14 LAB — CBC
HEMATOCRIT: 45.6 % (ref 39.0–52.0)
Hemoglobin: 15 g/dL (ref 13.0–17.0)
MCH: 30.3 pg (ref 26.0–34.0)
MCHC: 32.9 g/dL (ref 30.0–36.0)
MCV: 92.1 fL (ref 78.0–100.0)
Platelets: 313 10*3/uL (ref 150–400)
RBC: 4.95 MIL/uL (ref 4.22–5.81)
RDW: 13.3 % (ref 11.5–15.5)
WBC: 7.4 10*3/uL (ref 4.0–10.5)

## 2015-08-14 LAB — BRAIN NATRIURETIC PEPTIDE: B NATRIURETIC PEPTIDE 5: 61.6 pg/mL (ref 0.0–100.0)

## 2015-08-14 MED ORDER — CLONAZEPAM 0.5 MG PO TABS: 0.5000 mg | ORAL_TABLET | Freq: Two times a day (BID) | ORAL | Status: DC | PRN

## 2015-08-14 NOTE — ED Notes (Signed)
Pt reports bila ankle swelling with mild pain x 2 days.  Pt also reports SOB with exertion.  Pt denies any dizziness or cp.  Pt also reports L shoulder pain.  No injury.

## 2015-08-14 NOTE — Discharge Instructions (Signed)
Edema °Edema is an abnormal buildup of fluids in your body tissues. Edema is somewhat dependent on gravity to pull the fluid to the lowest place in your body. That makes the condition more common in the legs and thighs (lower extremities). Painless swelling of the feet and ankles is common and becomes more likely as you get older. It is also common in looser tissues, like around your eyes.  °When the affected area is squeezed, the fluid may move out of that spot and leave a dent for a few moments. This dent is called pitting.  °CAUSES  °There are many possible causes of edema. Eating too much salt and being on your feet or sitting for a long time can cause edema in your legs and ankles. Hot weather may make edema worse. Common medical causes of edema include: °· Heart failure. °· Liver disease. °· Kidney disease. °· Weak blood vessels in your legs. °· Cancer. °· An injury. °· Pregnancy. °· Some medications. °· Obesity.  °SYMPTOMS  °Edema is usually painless. Your skin may look swollen or shiny.  °DIAGNOSIS  °Your health care provider may be able to diagnose edema by asking about your medical history and doing a physical exam. You may need to have tests such as X-rays, an electrocardiogram, or blood tests to check for medical conditions that may cause edema.  °TREATMENT  °Edema treatment depends on the cause. If you have heart, liver, or kidney disease, you need the treatment appropriate for these conditions. General treatment may include: °· Elevation of the affected body part above the level of your heart. °· Compression of the affected body part. Pressure from elastic bandages or support stockings squeezes the tissues and forces fluid back into the blood vessels. This keeps fluid from entering the tissues. °· Restriction of fluid and salt intake. °· Use of a water pill (diuretic). These medications are appropriate only for some types of edema. They pull fluid out of your body and make you urinate more often. This  gets rid of fluid and reduces swelling, but diuretics can have side effects. Only use diuretics as directed by your health care provider. °HOME CARE INSTRUCTIONS  °· Keep the affected body part above the level of your heart when you are lying down.   °· Do not sit still or stand for prolonged periods.   °· Do not put anything directly under your knees when lying down. °· Do not wear constricting clothing or garters on your upper legs.   °· Exercise your legs to work the fluid back into your blood vessels. This may help the swelling go down.   °· Wear elastic bandages or support stockings to reduce ankle swelling as directed by your health care provider.   °· Eat a low-salt diet to reduce fluid if your health care provider recommends it.   °· Only take medicines as directed by your health care provider.  °SEEK MEDICAL CARE IF:  °· Your edema is not responding to treatment. °· You have heart, liver, or kidney disease and notice symptoms of edema. °· You have edema in your legs that does not improve after elevating them.   °· You have sudden and unexplained weight gain. °SEEK IMMEDIATE MEDICAL CARE IF:  °· You develop shortness of breath or chest pain.   °· You cannot breathe when you lie down. °· You develop pain, redness, or warmth in the swollen areas.   °· You have heart, liver, or kidney disease and suddenly get edema. °· You have a fever and your symptoms suddenly get worse. °MAKE SURE YOU:  °·   Understand these instructions.  Will watch your condition.  Will get help right away if you are not doing well or get worse.   This information is not intended to replace advice given to you by your health care provider. Make sure you discuss any questions you have with your health care provider.   Document Released: 07/11/2005 Document Revised: 08/01/2014 Document Reviewed: 05/03/2013 Elsevier Interactive Patient Education 2016 [employer]  [employer] Therapy Heat therapy can help ease sore, stiff, injured, and  tight muscles and joints. Heat relaxes your muscles, which may help ease your pain.  RISKS AND COMPLICATIONS If you have any of the following conditions, do not use heat therapy unless your health care provider has approved:  Poor circulation.  Healing wounds or scarred skin in the area being treated.  Diabetes, heart disease, or high blood pressure.  Not being able to feel (numbness) the area being treated.  Unusual swelling of the area being treated.  Active infections.  Blood clots.  Cancer.  Inability to communicate pain. This may include young children and people who have problems with their brain function (dementia).  Pregnancy. Heat therapy should only be used on old, pre-existing, or long-lasting (chronic) injuries. Do not use heat therapy on new injuries unless directed by your health care provider. HOW TO USE HEAT THERAPY There are several different kinds of heat therapy, including:  Moist heat pack.  Warm water bath.  Hot water bottle.  Electric heating pad.  Heated gel pack.  Heated wrap.  Electric heating pad. Use the heat therapy method suggested by your health care provider. Follow your health care provider's instructions on when and how to use heat therapy. GENERAL HEAT THERAPY RECOMMENDATIONS  Do not sleep while using heat therapy. Only use heat therapy while you are awake.  Your skin may turn pink while using heat therapy. Do not use heat therapy if your skin turns red.  Do not use heat therapy if you have new pain.  High heat or long exposure to heat can cause burns. Be careful when using heat therapy to avoid burning your skin.  Do not use heat therapy on areas of your skin that are already irritated, such as with a rash or sunburn. SEEK MEDICAL CARE IF:  You have blisters, redness, swelling, or numbness.  You have new pain.  Your pain is worse. MAKE SURE YOU:  Understand these instructions.  Will watch your condition.  Will get  help right away if you are not doing well or get worse.   This information is not intended to replace advice given to you by your health care provider. Make sure you discuss any questions you have with your health care provider.   Document Released: 10/03/2011 Document Revised: 08/01/2014 Document Reviewed: 09/03/2013 Elsevier Interactive Patient Education 2016 [employer] Inc[employer]  Joint Pain Joint pain, which is also called arthralgia, can be caused by many things. Joint pain often goes away when you follow your health care provider's instructions for relieving pain at home. However, joint pain can also be caused by conditions that require further treatment. Common causes of joint pain include:  Bruising in the area of the joint.  Overuse of the joint.  Wear and tear on the joints that occur with aging (osteoarthritis).  Various other forms of arthritis.  A buildup of a crystal form of uric acid in the joint (gout).  Infections of the joint (septic arthritis) or of the bone (osteomyelitis). Your health care provider may recommend medicine to  help with the pain. If your joint pain continues, additional tests may be needed to diagnose your condition. HOME CARE INSTRUCTIONS Watch your condition for any changes. Follow these instructions as directed to lessen the pain that you are feeling.  Take medicines only as directed by your health care provider.  Rest the affected area for as long as your health care provider says that you should. If directed to do so, raise the painful joint above the level of your heart while you are sitting or lying down.  Do not do things that cause or worsen pain.  If directed, apply ice to the painful area:  Put ice in a plastic bag.  Place a towel between your skin and the bag.  Leave the ice on for 20 minutes, 2-3 times per day.  Wear an elastic bandage, splint, or sling as directed by your health care provider. Loosen the elastic bandage or splint if  your fingers or toes become numb and tingle, or if they turn cold and blue.  Begin exercising or stretching the affected area as directed by your health care provider. Ask your health care provider what types of exercise are safe for you.  Keep all follow-up visits as directed by your health care provider. This is important. SEEK MEDICAL CARE IF:  Your pain increases, and medicine does not help.  Your joint pain does not improve within 3 days.  You have increased bruising or swelling.  You have a fever.  You lose 10 lb (4.5 kg) or more without trying. SEEK IMMEDIATE MEDICAL CARE IF:  You are not able to move the joint.  Your fingers or toes become numb or they turn cold and blue.   This information is not intended to replace advice given to you by your health care provider. Make sure you discuss any questions you have with your health care provider.   Document Released: 07/11/2005 Document Revised: 08/01/2014 Document Reviewed: 04/22/2014 Elsevier Interactive Patient Education [employer]

## 2015-08-14 NOTE — ED Provider Notes (Signed)
CSN: [id number]     Arrival date & time 08/14/15  1853 History   First MD Initiated Contact with Patient 08/14/15 2135     Chief Complaint  Patient presents with  . Ankle Problem     (Consider location/radiation/quality/duration/timing/severity/associated sxs/prior Treatment) HPI   Hayden Morris is a 50 y.o. male who presents for evaluation of bilateral ankle swelling for several days, left shoulder pain, and being out of his clonazepam. He denies fever, chills, cough, shortness of breath, chest pain, weakness or dizziness. There are no other known modifying factors.   Past Medical History  Diagnosis Date  . Anxiety   . Hypertension    Past Surgical History  Procedure Laterality Date  . Wrist surgery Left    No family history on file. Social History  Substance Use Topics  . Smoking status: Never Smoker   . Smokeless tobacco: None  . Alcohol Use: No    Review of Systems  All other systems reviewed and are negative.     Allergies  Review of patient's allergies indicates no known allergies.  Home Medications   Prior to Admission medications   Medication Sig Start Date End Date Taking? Authorizing Provider  amLODipine (NORVASC) 10 MG tablet Take 10 mg by mouth daily.    Historical Provider, MD  clonazePAM (KLONOPIN) 0.5 MG tablet Take 1 tablet (0.5 mg total) by mouth 2 (two) times daily as needed for anxiety. 10/24/14   Henrietta Hoover, NP  ibuprofen (ADVIL,MOTRIN) 600 MG tablet Take 1 tablet (600 mg total) by mouth every 6 (six) hours as needed. 05/04/15   [name], NP  oxyCODONE-acetaminophen (PERCOCET/ROXICET) 5-325 MG per tablet Take 1-2 tablets by mouth every 6 (six) hours as needed for severe pain. Patient not taking: Reported on 05/04/2015 01/14/15   Junius Finner, PA-C  valsartan (DIOVAN) 80 MG tablet Take 1 tablet (80 mg total) by mouth daily. Patient not taking: Reported on 01/14/2015 11/07/14   Ambrose Finland, NP   BP 132/99 mmHg  Pulse 85   Temp(Src) 98.5 F (36.9 C) (Oral)  Resp 16  Ht  (1.88 m)  Wt 222 lb (100.699 kg)  BMI 28.49 kg/m2  SpO2 96% Physical Exam  Constitutional: He is oriented to person, place, and time. He appears well-developed and well-nourished.  HENT:  Head: Normocephalic and atraumatic.  Right Ear: External ear normal.  Left Ear: External ear normal.  Eyes: Conjunctivae and EOM are normal. Pupils are equal, round, and reactive to light.  Neck: Normal range of motion and phonation normal. Neck supple.  Cardiovascular: Normal rate, regular rhythm and normal heart sounds.   Pulmonary/Chest: Effort normal and breath sounds normal. He exhibits no bony tenderness.  Abdominal: Soft.  Musculoskeletal: Normal range of motion.  1+ edema lower legs bilaterally. Calves nontender to palpation. Left shoulder tender anteriorly, mild weakness of the left rotator cuff, associated with pain.  Neurological: He is alert and oriented to person, place, and time. No cranial nerve deficit or sensory deficit. He exhibits normal muscle tone. Coordination normal.  Skin: Skin is warm, dry and intact.  Psychiatric: He has a normal mood and affect. His behavior is normal. Judgment and thought content normal.  Nursing note and vitals reviewed.   ED Course  Procedures (including critical care time)  Medications - No data to display  Patient Vitals for the past 24 hrs:  BP Temp Temp src Pulse Resp SpO2 Height Weight  08/14/15 1905 132/99 mmHg 98.5 F (36.9 C) Oral  85 16 96 %  (1.88 m) 222 lb (100.699 kg)    9:44 PM Reevaluation with update and discussion. After initial assessment and treatment, an updated evaluation reveals findings discussed with patient, all questions answered.Mancel Bale L    Labs Review Labs Reviewed  BASIC METABOLIC PANEL - Abnormal; Notable for the following:    Glucose, Bld 102 (*)    Creatinine, Ser 1.36 (*)    GFR calc non Af Amer 59 (*)    All other components within normal  limits  CBC  BRAIN NATRIURETIC PEPTIDE  I-STAT TROPOININ, ED    Imaging Review No results found. I have personally reviewed and evaluated these images and lab results as part of my medical decision-making.   EKG Interpretation None      MDM   Final diagnoses:  Peripheral edema  Anxiety  Left shoulder pain    Nonspecific ankle swelling, likely related to Norvasc. Doubt CHF, PE or pneumonia.  Nursing Notes Reviewed/ Care Coordinated Applicable Imaging Reviewed Interpretation of Laboratory Data incorporated into ED treatment  The patient appears reasonably screened and/or stabilized for discharge and I doubt any other medical condition or other [facility] requiring further screening, evaluation, or treatment in the ED at this time prior to discharge.  Plan: Home Medications- usual; Home Treatments- elevate legs; return here if the recommended treatment, does not improve the symptoms; Recommended follow up- PCP prn     Mancel Bale, MD 08/14/15 2148

## 2015-09-28 ENCOUNTER — Emergency Department (HOSPITAL_COMMUNITY)
Admission: EM | Admit: 2015-09-28 | Discharge: 2015-09-28 | Disposition: A | Discharge status: Home / Self Care | Payer: Managed Care, Other (non HMO) | Attending: Emergency Medicine | Admitting: Emergency Medicine

## 2015-09-28 ENCOUNTER — Encounter (HOSPITAL_COMMUNITY): Payer: Self-pay | Admitting: *Deleted

## 2015-09-28 DIAGNOSIS — R1084 Generalized abdominal pain: Secondary | ICD-10-CM

## 2015-09-28 DIAGNOSIS — R112 Nausea with vomiting, unspecified: Secondary | ICD-10-CM

## 2015-09-28 DIAGNOSIS — R197 Diarrhea, unspecified: Secondary | ICD-10-CM | POA: Insufficient documentation

## 2015-09-28 DIAGNOSIS — I10 Essential (primary) hypertension: Secondary | ICD-10-CM | POA: Diagnosis not present

## 2015-09-28 DIAGNOSIS — Z79899 Other long term (current) drug therapy: Secondary | ICD-10-CM | POA: Insufficient documentation

## 2015-09-28 DIAGNOSIS — F419 Anxiety disorder, unspecified: Secondary | ICD-10-CM | POA: Diagnosis not present

## 2015-09-28 LAB — COMPREHENSIVE METABOLIC PANEL
ALK PHOS: 74 U/L (ref 38–126)
ALT: 52 U/L (ref 17–63)
ANION GAP: 5 (ref 5–15)
AST: 40 U/L (ref 15–41)
Albumin: 4.2 g/dL (ref 3.5–5.0)
BUN: 14 mg/dL (ref 6–20)
CALCIUM: 9.5 mg/dL (ref 8.9–10.3)
CO2: 25 mmol/L (ref 22–32)
CREATININE: 1.28 mg/dL — AB (ref 0.61–1.24)
Chloride: 108 mmol/L (ref 101–111)
GFR calc non Af Amer: >60 mL/min (ref >60)
Glucose, Bld: 129 mg/dL — ABNORMAL HIGH (ref 65–99)
Potassium: 4.4 mmol/L (ref 3.5–5.1)
Sodium: 138 mmol/L (ref 135–145)
Total Bilirubin: 0.5 mg/dL (ref 0.3–1.2)
Total Protein: 8.3 g/dL — ABNORMAL HIGH (ref 6.5–8.1)

## 2015-09-28 LAB — CBC WITH DIFFERENTIAL/PLATELET
Basophils Absolute: 0 10*3/uL (ref 0.0–0.1)
Basophils Relative: 0 %
Eosinophils Absolute: 0.2 10*3/uL (ref 0.0–0.7)
Eosinophils Relative: 3 %
HCT: 49.3 % (ref 39.0–52.0)
HEMOGLOBIN: 15.8 g/dL (ref 13.0–17.0)
LYMPHS PCT: 13 %
Lymphs Abs: 0.8 10*3/uL (ref 0.7–4.0)
MCH: 29.7 pg (ref 26.0–34.0)
MCHC: 32 g/dL (ref 30.0–36.0)
MCV: 92.7 fL (ref 78.0–100.0)
Monocytes Absolute: 0.4 10*3/uL (ref 0.1–1.0)
Monocytes Relative: 6 %
NEUTROS PCT: 78 %
Neutro Abs: 4.8 10*3/uL (ref 1.7–7.7)
Platelets: 267 10*3/uL (ref 150–400)
RBC: 5.32 MIL/uL (ref 4.22–5.81)
RDW: 13.3 % (ref 11.5–15.5)
WBC: 6.1 10*3/uL (ref 4.0–10.5)

## 2015-09-28 LAB — LIPASE, BLOOD: Lipase: 33 U/L (ref 11–51)

## 2015-09-28 MED ORDER — ONDANSETRON 4 MG PO TBDP
4.0000 mg | ORAL_TABLET | Freq: Once | ORAL | Status: AC
  Administered 2015-09-28: 4 mg via ORAL
  Filled 2015-09-28: qty 1

## 2015-09-28 MED ORDER — ONDANSETRON HCL 4 MG PO TABS: 4.0000 mg | ORAL_TABLET | Freq: Four times a day (QID) | ORAL | Status: AC

## 2015-09-28 NOTE — ED Notes (Signed)
Patient is alert and oriented x4.  He is complaining of abdominal pain with generalized body aches that started yesterday. Patient adds that.  Currently he rates his pain 7 of 10 with nausea and vomiting.

## 2015-09-28 NOTE — ED Notes (Signed)
Pt tolerating fluid well.

## 2015-09-28 NOTE — ED Provider Notes (Signed)
CSN: [id number]     Arrival date & time 09/28/15  0502 History   None    Chief Complaint  Patient presents with  . Abdominal Pain   (Consider location/radiation/quality/duration/timing/severity/associated sxs/prior Treatment) Patient is a 50 y.o. male presenting with abdominal pain. The history is provided by the patient. No language interpreter was used.  Abdominal Pain Associated symptoms: diarrhea, nausea and vomiting   Associated symptoms: no chills and no fever    Hayden Morris is a 50 year old male with a history of hypertension and anxiety who presents with Gradual onset generalized abdominal pain that began yesterday that he describes as waves of pain. Denies any recent Aspirin or NSAID use. Denies any treatment prior to arrival. States he was up all night and vomited 4 times without blood. He also had several episodes of nonbloody diarrhea. States he is nauseated. Pain is 7/10. Denies any previous abdominal surgeries. Denies any fever, chills, chest pain, shortness of breath, or urinary symptoms.  Past Medical History  Diagnosis Date  . Anxiety   . Hypertension   . Anxiety    Past Surgical History  Procedure Laterality Date  . Wrist surgery Left    History reviewed. No pertinent family history. Social History  Substance Use Topics  . Smoking status: Never Smoker   . Smokeless tobacco: None  . Alcohol Use: No    Review of Systems  Constitutional: Negative for fever and chills.  Gastrointestinal: Positive for nausea, vomiting, abdominal pain and diarrhea. Negative for blood in stool.  All other systems reviewed and are negative.     Allergies  Review of patient's allergies indicates no known allergies.  Home Medications   Prior to Admission medications   Medication Sig Start Date End Date Taking? Authorizing Provider  amLODipine (NORVASC) 10 MG tablet Take 10 mg by mouth daily.    Historical Provider, MD  clonazePAM (KLONOPIN) 0.5 MG tablet Take 1 tablet (0.5 mg  total) by mouth 2 (two) times daily as needed for anxiety. 08/14/15   Mancel Bale, MD  clonazePAM (KLONOPIN) 1 MG tablet Take 1 mg by mouth 2 (two) times daily as needed. 06/10/15   Historical Provider, MD  ibuprofen (ADVIL,MOTRIN) 600 MG tablet Take 1 tablet (600 mg total) by mouth every 6 (six) hours as needed. 05/04/15   [name], NP  ondansetron (ZOFRAN) 4 MG tablet Take 1 tablet (4 mg total) by mouth every 6 (six) hours. 09/28/15   Kiyra Slaubaugh Patel-Mills, PA-C  oxyCODONE-acetaminophen (PERCOCET/ROXICET) 5-325 MG per tablet Take 1-2 tablets by mouth every 6 (six) hours as needed for severe pain. 01/14/15   Junius Finner, PA-C  valsartan (DIOVAN) 80 MG tablet Take 1 tablet (80 mg total) by mouth daily. 11/07/14   Ambrose Finland, NP   BP 118/85 mmHg  Pulse 80  Temp(Src) 97.6 F (36.4 C) (Oral)  Resp 16  SpO2 100% Physical Exam  Constitutional: He is oriented to person, place, and time. He appears well-developed and well-nourished.  HENT:  Head: Normocephalic and atraumatic.  Eyes: Conjunctivae are normal.  Neck: Normal range of motion. Neck supple.  Cardiovascular: Normal rate, regular rhythm and normal heart sounds.   Pulmonary/Chest: Effort normal and breath sounds normal.  Abdominal: Soft. Normal appearance. He exhibits no distension. There is generalized tenderness. There is no rebound and no guarding.    Generalized abdominal tenderness to palpation. No abdominal distention. No guarding or rebound.  Musculoskeletal: Normal range of motion.  Neurological: He is alert and oriented to person, place, and  time.  Skin: Skin is warm and dry.  Nursing note and vitals reviewed.   ED Course  Procedures (including critical care time) Labs Review Labs Reviewed  COMPREHENSIVE METABOLIC PANEL - Abnormal; Notable for the following:    Glucose, Bld 129 (*)    Creatinine, Ser 1.28 (*)    Total Protein 8.3 (*)    All other components within normal limits  CBC WITH DIFFERENTIAL/PLATELET    LIPASE, BLOOD    Imaging Review No results found. I have personally reviewed and evaluated these images and lab results as part of my medical decision-making.   EKG Interpretation None      MDM   Final diagnoses:  Generalized abdominal pain  Nausea vomiting and diarrhea   Patient presents for generalized abdominal pain that began yesterday. No treatment prior to arrival. He has generalized abdominal tenderness on exam. Labs are not concerning. No peritoneal signs. I do not believe this is a surgical abdomen. Patient was given Zofran and felt much better. He was by mouth challenged and able to tolerate fluids without vomiting in the ED. I discussed staying well-hydrated. Patient was also given a work note. I discussed return precautions as well as follow-up. Patient agrees with plan.     Catha GosselinHanna Patel-Mills, PA-C 09/29/15 [phone]  Laurence Spatesachel Morgan Little, MD 09/29/15 1459

## 2015-09-28 NOTE — Discharge Instructions (Signed)
Abdominal Pain, Adult Stay well-hydrated. Take Zofran for nausea. Follow up with her primary care physician. Return for inability to tolerate fluids or increased abdominal pain. Many things can cause belly (abdominal) pain. Most times, the belly pain is not dangerous. Many cases of belly pain can be watched and treated at home. HOME CARE   Do not take medicines that help you go poop (laxatives) unless told to by your doctor.  Only take medicine as told by your doctor.  Eat or drink as told by your doctor. Your doctor will tell you if you should be on a special diet. GET HELP IF:  You do not know what is causing your belly pain.  You have belly pain while you are sick to your stomach (nauseous) or have runny poop (diarrhea).  You have pain while you pee or poop.  Your belly pain wakes you up at night.  You have belly pain that gets worse or better when you eat.  You have belly pain that gets worse when you eat fatty foods.  You have a fever. GET HELP RIGHT AWAY IF:   The pain does not go away within 2 hours.  You keep throwing up (vomiting).  The pain changes and is only in the right or left part of the belly.  You have bloody or tarry looking poop. MAKE SURE YOU:   Understand these instructions.  Will watch your condition.  Will get help right away if you are not doing well or get worse.   This information is not intended to replace advice given to you by your health care provider. Make sure you discuss any questions you have with your health care provider.   Document Released: 12/28/2007 Document Revised: 08/01/2014 Document Reviewed: 03/20/2013 Elsevier Interactive Patient Education 2016 Else[employer] Inc.  Diarrhea Diarrhea is watery poop (stool). It can make you feel weak, tired, thirsty, or give you a dry mouth (signs of dehydration). Watery poop is a sign of another problem, most often an infection. It often lasts 2-3 days. It can last longer if it is a sign of  something serious. Take care of yourself as told by your doctor. HOME CARE   Drink 1 cup (8 ounces) of fluid each time you have watery poop.  Do not drink the following fluids:  Those that contain simple sugars (fructose, glucose, galactose, lactose, sucrose, maltose).  Sports drinks.  Fruit juices.  Whole milk products.  Sodas.  Drinks with caffeine (coffee, tea, soda) or alcohol.  Oral rehydration solution may be used if the doctor says it is okay. You may make your own solution. Follow this recipe:   - teaspoon table salt.   teaspoon baking soda.   teaspoon salt substitute containing potassium chloride.  1 tablespoons sugar.  1 liter (34 ounces) of water.  Avoid the following foods:  High fiber foods, such as raw fruits and vegetables.  Nuts, seeds, and whole grain breads and cereals.   Those that are sweetened with sugar alcohols (xylitol, sorbitol, mannitol).  Try eating the following foods:  Starchy foods, such as rice, toast, pasta, low-sugar cereal, oatmeal, baked potatoes, crackers, and bagels.  Bananas.  Applesauce.  Eat probiotic-rich foods, such as yogurt and milk products that are fermented.  Wash your hands well after each time you have watery poop.  Only take medicine as told by your doctor.  Take a warm bath to help lessen burning or pain from having watery poop. GET HELP RIGHT AWAY IF:   You  cannot drink fluids without throwing up (vomiting).  You keep throwing up.  You have blood in your poop, or your poop looks black and tarry.  You do not pee (urinate) in 6-8 hours, or there is only a small amount of very dark pee.  You have belly (abdominal) pain that gets worse or stays in the same spot (localizes).  You are weak, dizzy, confused, or light-headed.  You have a very bad headache.  Your watery poop gets worse or does not get better.  You have a fever or lasting symptoms for more than 2-3 days.  You have a fever and your  symptoms suddenly get worse. MAKE SURE YOU:   Understand these instructions.  Will watch your condition.  Will get help right away if you are not doing well or get worse.   This information is not intended to replace advice given to you by your health care provider. Make sure you discuss any questions you have with your health care provider.   Document Released: 12/28/2007 Document Revised: 08/01/2014 Document Reviewed: 03/18/2012 Elsevier Interactive Patient Education [employer]

## 2015-09-29 ENCOUNTER — Encounter (HOSPITAL_COMMUNITY): Payer: Self-pay

## 2015-09-29 ENCOUNTER — Emergency Department (HOSPITAL_COMMUNITY)
Admission: EM | Admit: 2015-09-29 | Discharge: 2015-09-29 | Disposition: A | Discharge status: Home / Self Care | Payer: Managed Care, Other (non HMO) | Attending: Emergency Medicine | Admitting: Emergency Medicine

## 2015-09-29 DIAGNOSIS — I10 Essential (primary) hypertension: Secondary | ICD-10-CM | POA: Diagnosis not present

## 2015-09-29 DIAGNOSIS — Z79899 Other long term (current) drug therapy: Secondary | ICD-10-CM | POA: Insufficient documentation

## 2015-09-29 DIAGNOSIS — R197 Diarrhea, unspecified: Secondary | ICD-10-CM | POA: Insufficient documentation

## 2015-09-29 DIAGNOSIS — R1030 Lower abdominal pain, unspecified: Secondary | ICD-10-CM | POA: Diagnosis not present

## 2015-09-29 DIAGNOSIS — R112 Nausea with vomiting, unspecified: Secondary | ICD-10-CM | POA: Diagnosis not present

## 2015-09-29 DIAGNOSIS — F419 Anxiety disorder, unspecified: Secondary | ICD-10-CM | POA: Diagnosis not present

## 2015-09-29 LAB — COMPREHENSIVE METABOLIC PANEL
ALBUMIN: 3.8 g/dL (ref 3.5–5.0)
ALT: 65 U/L — AB (ref 17–63)
AST: 51 U/L — AB (ref 15–41)
Alkaline Phosphatase: 69 U/L (ref 38–126)
Anion gap: 6 (ref 5–15)
BILIRUBIN TOTAL: 0.8 mg/dL (ref 0.3–1.2)
BUN: 9 mg/dL (ref 6–20)
CHLORIDE: 104 mmol/L (ref 101–111)
CO2: 24 mmol/L (ref 22–32)
CREATININE: 1.24 mg/dL (ref 0.61–1.24)
Calcium: 8.6 mg/dL — ABNORMAL LOW (ref 8.9–10.3)
GFR calc Af Amer: >60 mL/min (ref >60)
GFR calc non Af Amer: >60 mL/min (ref >60)
GLUCOSE: 139 mg/dL — AB (ref 65–99)
POTASSIUM: 3.5 mmol/L (ref 3.5–5.1)
SODIUM: 134 mmol/L — AB (ref 135–145)
TOTAL PROTEIN: 7.8 g/dL (ref 6.5–8.1)

## 2015-09-29 LAB — CBC WITH DIFFERENTIAL/PLATELET
BASOS ABS: 0 10*3/uL (ref 0.0–0.1)
BASOS PCT: 0 %
EOS ABS: 0.1 10*3/uL (ref 0.0–0.7)
EOS PCT: 2 %
HCT: 46.4 % (ref 39.0–52.0)
HEMOGLOBIN: 15.1 g/dL (ref 13.0–17.0)
Lymphocytes Relative: 12 %
Lymphs Abs: 0.7 10*3/uL (ref 0.7–4.0)
MCH: 29.7 pg (ref 26.0–34.0)
MCHC: 32.5 g/dL (ref 30.0–36.0)
MCV: 91.3 fL (ref 78.0–100.0)
Monocytes Absolute: 0.4 10*3/uL (ref 0.1–1.0)
Monocytes Relative: 8 %
NEUTROS PCT: 78 %
Neutro Abs: 4.4 10*3/uL (ref 1.7–7.7)
PLATELETS: 250 10*3/uL (ref 150–400)
RBC: 5.08 MIL/uL (ref 4.22–5.81)
RDW: 13.1 % (ref 11.5–15.5)
WBC: 5.6 10*3/uL (ref 4.0–10.5)

## 2015-09-29 LAB — LIPASE, BLOOD: Lipase: 25 U/L (ref 11–51)

## 2015-09-29 MED ORDER — LOPERAMIDE HCL 2 MG PO CAPS: 2.0000 mg | ORAL_CAPSULE | Freq: Four times a day (QID) | ORAL | Status: AC | PRN

## 2015-09-29 NOTE — ED Provider Notes (Signed)
CSN: [id number]     Arrival date & time 09/29/15  0746 History   First MD Initiated Contact with Patient 09/29/15 0815     Chief Complaint  Patient presents with  . Abdominal Pain  . Diarrhea     (Consider location/radiation/quality/duration/timing/severity/associated sxs/prior Treatment) HPI   Patient is a 50 year old male with past medical history of hypertension who presents the ED with complaint of diarrhea, onset 2 days. Patient reports he was seen in the ED yesterday for nausea, vomiting, diarrhea and abdominal pain. He notes he was discharged home with Zofran but has continued to have episodes of nonbloody diarrhea. Patient reports having 2 episodes of diarrhea this morning and notes he had 8-10 episodes of nonbloody diarrhea yesterday. Patient denies taking any medications for his diarrhea. He notes having intermittent aching/cramping pain to his lower abdomen, denies any aggravating or alleviating factors. Patient reports his nausea and vomiting have improved since taking the Zofran he was prescribed from the ED yesterday. Denies fever, chills, cough, shortness of breath, chest pain, nausea, vomiting, urinary symptoms. Patient reports he has been drinking water and Gatorade to try to stay hydrated at home. Patient reports his fianc had similar symptoms of abdominal pain, vomiting and diarrhea a few weeks ago. Patient denies any history of abdominal surgeries.  Past Medical History  Diagnosis Date  . Anxiety   . Hypertension   . Anxiety    Past Surgical History  Procedure Laterality Date  . Wrist surgery Left    History reviewed. No pertinent family history. Social History  Substance Use Topics  . Smoking status: Never Smoker   . Smokeless tobacco: None  . Alcohol Use: No    Review of Systems  Gastrointestinal: Positive for abdominal pain and diarrhea.  All other systems reviewed and are negative.     Allergies  Review of patient's allergies indicates no known  allergies.  Home Medications   Prior to Admission medications   Medication Sig Start Date End Date Taking? Authorizing Provider  amLODipine (NORVASC) 10 MG tablet Take 10 mg by mouth daily.    Historical Provider, MD  clonazePAM (KLONOPIN) 0.5 MG tablet Take 1 tablet (0.5 mg total) by mouth 2 (two) times daily as needed for anxiety. 08/14/15   Mancel BaleElliott Wentz, MD  clonazePAM (KLONOPIN) 1 MG tablet Take 1 mg by mouth 2 (two) times daily as needed. 06/10/15   Historical Provider, MD  ibuprofen (ADVIL,MOTRIN) 600 MG tablet Take 1 tablet (600 mg total) by mouth every 6 (six) hours as needed. 05/04/15   Earley FavorGail Schulz, NP  ondansetron (ZOFRAN) 4 MG tablet Take 1 tablet (4 mg total) by mouth every 6 (six) hours. 09/28/15   Hanna Patel-Mills, PA-C  oxyCODONE-acetaminophen (PERCOCET/ROXICET) 5-325 MG per tablet Take 1-2 tablets by mouth every 6 (six) hours as needed for severe pain. 01/14/15   Junius FinnerErin O'Malley, PA-C  valsartan (DIOVAN) 80 MG tablet Take 1 tablet (80 mg total) by mouth daily. 11/07/14   Ambrose FinlandValerie A Keck, NP   BP 110/85 mmHg  Pulse 88  Temp(Src) 98.1 F (36.7 C) (Oral)  Resp 18  SpO2 100% Physical Exam  Constitutional: He is oriented to person, place, and time. He appears well-developed and well-nourished. No distress.  HENT:  Head: Normocephalic and atraumatic.  Mouth/Throat: Oropharynx is clear and moist. No oropharyngeal exudate.  Eyes: Conjunctivae and EOM are normal. Right eye exhibits no discharge. Left eye exhibits no discharge. No scleral icterus.  Neck: Normal range of motion. Neck supple.  Cardiovascular: Normal  rate, regular rhythm, normal heart sounds and intact distal pulses.   Pulmonary/Chest: Effort normal and breath sounds normal. No respiratory distress. He has no wheezes. He has no rales. He exhibits no tenderness.  Abdominal: Soft. Bowel sounds are normal. He exhibits no distension and no mass. There is no tenderness. There is no rebound and no guarding.  Musculoskeletal:  Normal range of motion. He exhibits no edema.  Lymphadenopathy:    He has no cervical adenopathy.  Neurological: He is alert and oriented to person, place, and time.  Skin: Skin is warm and dry. He is not diaphoretic.  Nursing note and vitals reviewed.   ED Course  Procedures (including critical care time) Labs Review Labs Reviewed  COMPREHENSIVE METABOLIC PANEL - Abnormal; Notable for the following:    Sodium 134 (*)    Glucose, Bld 139 (*)    Calcium 8.6 (*)    AST 51 (*)    ALT 65 (*)    All other components within normal limits  CBC WITH DIFFERENTIAL/PLATELET  LIPASE, BLOOD    Imaging Review No results found. I have personally reviewed and evaluated these images and lab results as part of my medical decision-making.   EKG Interpretation None      MDM   Final diagnoses:  Diarrhea, unspecified type    Patient presents with lower abdominal cramping and diarrhea for the past 2 days. Denies fever. Patient seen in the ED yesterday for similar symptoms and nausea/vomiting, labs unremarkable, discharged home with Zofran. Patient reports his nausea and vomiting have improved but notes he has continued to have episodes of nonbloody diarrhea, endorses having 2 episodes this morning. Patient denies taking any medications for diarrhea. VSS. Exam unremarkable. AST 51, ALT 65. Remaining labs unremarkable. On reexamination patient reports he is feeling well and denies any pain or complaints. Abdominal exam benign. Denies any episodes of diarrhea since arrival to the ED. I suspect patient's symptoms are likely due to to his viral gastroenteritis which she was diagnosed with yesterday in the ED. Plan to discharge patient home with symptomatic treatment. Advised patient to follow up with his PCP regarding his mildly elevated LFTs.   Evaluation does not show pathology requring ongoing emergent intervention or admission. Pt is hemodynamically stable and mentating appropriately. Discussed  findings/results and plan with patient/guardian, who agrees with plan. All questions answered. Return precautions discussed and outpatient follow up given.      Satira Sark [place], [place] 09/29/15 1033  Marily Memos, MD 09/30/15 [phone]

## 2015-09-29 NOTE — ED Notes (Signed)
Pt here yesterday for same.  Abdominal pain with diarrhea x 2 days.  Pt states given nausea meds and meds handling nausea.  However, continues to have diarrhea with abdominal pain

## 2015-09-29 NOTE — Discharge Instructions (Signed)
Take your medication as prescribed. You may continue taking Zofran as prescribed as needed for nausea. Continue drinking fluids to remain hydrated. I recommend following up with your primary care provider in 2-3 days for follow-up and to have your liver enzymes rechecked due to your elevated liver enzymes (AST 51, ALT 65) in the ED today. Please return to the Emergency Department if symptoms worsen or new onset of fever, vomiting, unable tolerate fluids, worsening abdominal pain, blood in stool.

## 2015-10-07 ENCOUNTER — Encounter (HOSPITAL_COMMUNITY): Payer: Self-pay | Admitting: Emergency Medicine

## 2015-10-07 ENCOUNTER — Emergency Department (HOSPITAL_COMMUNITY)
Admission: EM | Admit: 2015-10-07 | Discharge: 2015-10-07 | Disposition: A | Discharge status: Home / Self Care | Payer: Managed Care, Other (non HMO) | Attending: Emergency Medicine | Admitting: Emergency Medicine

## 2015-10-07 ENCOUNTER — Emergency Department (HOSPITAL_COMMUNITY): Payer: Managed Care, Other (non HMO)

## 2015-10-07 DIAGNOSIS — Y9301 Activity, walking, marching and hiking: Secondary | ICD-10-CM | POA: Insufficient documentation

## 2015-10-07 DIAGNOSIS — X58XXXA Exposure to other specified factors, initial encounter: Secondary | ICD-10-CM | POA: Insufficient documentation

## 2015-10-07 DIAGNOSIS — R202 Paresthesia of skin: Secondary | ICD-10-CM | POA: Insufficient documentation

## 2015-10-07 DIAGNOSIS — I10 Essential (primary) hypertension: Secondary | ICD-10-CM | POA: Diagnosis not present

## 2015-10-07 DIAGNOSIS — F419 Anxiety disorder, unspecified: Secondary | ICD-10-CM | POA: Insufficient documentation

## 2015-10-07 DIAGNOSIS — S8991XA Unspecified injury of right lower leg, initial encounter: Secondary | ICD-10-CM | POA: Insufficient documentation

## 2015-10-07 DIAGNOSIS — Y990 Civilian activity done for income or pay: Secondary | ICD-10-CM | POA: Diagnosis not present

## 2015-10-07 DIAGNOSIS — Z79899 Other long term (current) drug therapy: Secondary | ICD-10-CM | POA: Diagnosis not present

## 2015-10-07 DIAGNOSIS — M25461 Effusion, right knee: Secondary | ICD-10-CM | POA: Diagnosis not present

## 2015-10-07 DIAGNOSIS — Y9289 Other specified places as the place of occurrence of the external cause: Secondary | ICD-10-CM | POA: Insufficient documentation

## 2015-10-07 DIAGNOSIS — M25561 Pain in right knee: Secondary | ICD-10-CM

## 2015-10-07 MED ORDER — IBUPROFEN 800 MG PO TABS: 800.0000 mg | ORAL_TABLET | Freq: Three times a day (TID) | ORAL | Status: DC

## 2015-10-07 MED ORDER — HYDROCODONE-ACETAMINOPHEN 5-325 MG PO TABS: 2.0000 | ORAL_TABLET | ORAL | Status: AC | PRN

## 2015-10-07 NOTE — ED Provider Notes (Signed)
CSN: [id number]     Arrival date & time 10/07/15  1933 History  By signing my name below, I, Hayden Morris, attest that this documentation has been prepared under the direction and in the presence of Melburn HakeNicole Nadeau, [place]. Electronically Signed: Ronney LionSuzanne Morris, ED Scribe. 10/07/2015. 8:32 PM.    Chief Complaint  Patient presents with  . Knee Pain   The history is provided by the patient. No language interpreter was used.    HPI Comments: Hayden Morris is a 50 y.o. male with a history of anxiety and hypertension, who presents to the Emergency Department complaining of atraumatic, constant, 8/10, medial right knee pain that began while patient was walking normally at work today. He states he heard a 'pop' while walking and immediately felt pain. He notes mild tingling in his right foot. He denies a history of any prior right knee injuries. Walking exacerbates the pain. Patient states he had applied ice, elevated the knee, and took ibuprofen at home (last dosage taken 2 hours ago) with mild relief per treatment. 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 Patient denies any other pains or injuries.   Past Medical History  Diagnosis Date  . Anxiety   . Hypertension   . Anxiety    Past Surgical History  Procedure Laterality Date  . Wrist surgery Left    No family history on file. Social History  Substance Use Topics  . Smoking status: Never Smoker   . Smokeless tobacco: None  . Alcohol Use: No    Review of Systems  Musculoskeletal: Positive for arthralgias (right knee pain).  Neurological:       Positive for mild paresthesia in left foot.     Allergies  Review of patient's allergies indicates no known allergies.  Home Medications   Prior to Admission medications   Medication Sig Start Date End Date Taking? Authorizing Provider  amLODipine (NORVASC) 10 MG tablet Take 10 mg by mouth daily.    Historical Provider, MD  clonazePAM (KLONOPIN) 0.5 MG tablet Take 1 tablet  (0.5 mg total) by mouth 2 (two) times daily as needed for anxiety. 08/14/15   Mancel BaleElliott Wentz, MD  clonazePAM (KLONOPIN) 1 MG tablet Take 1 mg by mouth 2 (two) times daily as needed. 06/10/15   Historical Provider, MD  HYDROcodone-acetaminophen (NORCO/VICODIN) 5-325 MG tablet Take 2 tablets by mouth every 4 (four) hours as needed. 10/07/15   Barrett HenleNicole Elizabeth Nadeau, PA-C  ibuprofen (ADVIL,MOTRIN) 800 MG tablet Take 1 tablet (800 mg total) by mouth 3 (three) times daily. 10/07/15   Barrett HenleNicole Elizabeth Nadeau, PA-C  loperamide (IMODIUM) 2 MG capsule Take 1 capsule (2 mg total) by mouth 4 (four) times daily as needed for diarrhea or loose stools. 09/29/15   Barrett HenleNicole Elizabeth Nadeau, PA-C  ondansetron (ZOFRAN) 4 MG tablet Take 1 tablet (4 mg total) by mouth every 6 (six) hours. 09/28/15   Hanna Patel-Mills, PA-C  oxyCODONE-acetaminophen (PERCOCET/ROXICET) 5-325 MG per tablet Take 1-2 tablets by mouth every 6 (six) hours as needed for severe pain. 01/14/15   Junius FinnerErin O'Malley, PA-C  valsartan (DIOVAN) 80 MG tablet Take 1 tablet (80 mg total) by mouth daily. 11/07/14   Ambrose FinlandValerie A Keck, NP   BP 131/90 mmHg  Pulse 94  Temp(Src) 98.1 F (36.7 C) (Oral)  Resp 18  Ht 6\' 2"  (1.88 m)  Wt 97.523 kg  BMI 27.59 kg/m2  SpO2 98% Physical Exam  Constitutional: He is oriented to person, place, and time. He appears well-developed  and well-nourished.  HENT:  Head: Normocephalic and atraumatic.  Eyes: Conjunctivae and EOM are normal. Right eye exhibits no discharge. Left eye exhibits no discharge. No scleral icterus.  Neck: Normal range of motion. Neck supple.  Cardiovascular: Normal rate.   Pulmonary/Chest: Effort normal.  Musculoskeletal: Normal range of motion. He exhibits no edema.       Right knee: He exhibits swelling (Mild swelling noted to medial anterior knee.). He exhibits normal range of motion, no effusion, no ecchymosis, no deformity, no laceration, no erythema, normal alignment, no LCL laxity, normal patellar  mobility, normal meniscus and no MCL laxity. Tenderness found. Medial joint line tenderness noted.  FROM of right ankle and foot. 5/5 strength. Sensation intact. Cap refill <2 seconds. 2+ DP pulses. Patient able to stand and ambulate.  Neurological: He is alert and oriented to person, place, and time.  Skin: Skin is warm and dry.  Nursing note and vitals reviewed.   ED Course  Procedures (including critical care time)  DIAGNOSTIC STUDIES: Oxygen Saturation is 98% on RA, normal by my interpretation.    COORDINATION OF CARE: 7:54 PM - Discussed treatment plan with pt at bedside which includes right knee XR. Pt verbalized understanding and agreed to plan.   Imaging Review Dg Knee Complete 4 Views Right  10/07/2015  CLINICAL DATA:  Right knee pain following bending at work, no known injury, initial encounter EXAM: RIGHT KNEE - COMPLETE 4+ VIEW COMPARISON:  None. FINDINGS: There is no evidence of fracture, dislocation, or joint effusion. There is no evidence of arthropathy or other focal bone abnormality. Soft tissues are unremarkable. IMPRESSION: No acute abnormality noted. Electronically Signed   By: Alcide Clever M.D.   On: 10/07/2015 20:23   I have personally reviewed and evaluated these images and lab results as part of my medical decision-making.  MDM   Final diagnoses:  Right knee pain   Patient presents with right knee pain that occurred today while walking at work. VSS. Exam revealed mild swelling to right medial knee with tenderness to palpation, right leg otherwise neurovascular intact. Patient able to stand and ambulate without assistance. Pt is able ambulate, no bony abnormality or deformity, no erythema or excessive heat, no evidence of cellulitis, DVT, or septic joint. Right knee x-ray negative. Plan to discharge patient home with symptomatic treatment. Advised patient to follow up with the orthopedic office given at discharge if symptoms have not improved over the next week.     I personally performed the services described in this documentation, which was scribed in my presence. The recorded information has been reviewed and is accurate.     Satira Sark [place], [place] 10/07/15 2048  Marily Memos, MD 10/07/15 [phone]

## 2015-10-07 NOTE — ED Notes (Signed)
See EDP assessment 

## 2015-10-07 NOTE — Discharge Instructions (Signed)
Take your medications as prescribed. I recommend eating prior to taking ibuprofen to prevent GI side effects. I also recommend resting, elevating and applying ice to her knee for 15-20 minutes 3-4 times daily. If your symptoms have not improved over the next week I recommend following up with the orthopedic office listed above. Please return to the Emergency Department if symptoms worsen or new onset of fever, redness, swelling, numbness, tingling, weakness.

## 2015-10-07 NOTE — ED Notes (Signed)
Pt states he was walking at work and felt R knee pain, felt like his "knee gave out" pt denies any injury, reports swelling but none noted upon assessment. Has been resting, elevating, using ibuprofen.

## 2015-10-17 ENCOUNTER — Emergency Department (HOSPITAL_COMMUNITY)
Admission: EM | Admit: 2015-10-17 | Discharge: 2015-10-17 | Disposition: A | Discharge status: Home / Self Care | Payer: Managed Care, Other (non HMO) | Attending: Emergency Medicine | Admitting: Emergency Medicine

## 2015-10-17 ENCOUNTER — Encounter (HOSPITAL_COMMUNITY): Payer: Self-pay | Admitting: Emergency Medicine

## 2015-10-17 DIAGNOSIS — Z791 Long term (current) use of non-steroidal anti-inflammatories (NSAID): Secondary | ICD-10-CM | POA: Diagnosis not present

## 2015-10-17 DIAGNOSIS — F419 Anxiety disorder, unspecified: Secondary | ICD-10-CM | POA: Diagnosis not present

## 2015-10-17 DIAGNOSIS — K0889 Other specified disorders of teeth and supporting structures: Secondary | ICD-10-CM | POA: Diagnosis not present

## 2015-10-17 DIAGNOSIS — Z79899 Other long term (current) drug therapy: Secondary | ICD-10-CM | POA: Insufficient documentation

## 2015-10-17 DIAGNOSIS — I10 Essential (primary) hypertension: Secondary | ICD-10-CM | POA: Diagnosis not present

## 2015-10-17 MED ORDER — OXYCODONE-ACETAMINOPHEN 5-325 MG PO TABS
1.0000 | ORAL_TABLET | Freq: Once | ORAL | Status: AC
  Administered 2015-10-17: 1 via ORAL
  Filled 2015-10-17: qty 1

## 2015-10-17 MED ORDER — PENICILLIN V POTASSIUM 500 MG PO TABS: 500.0000 mg | ORAL_TABLET | Freq: Three times a day (TID) | ORAL | Status: DC

## 2015-10-17 NOTE — ED Notes (Signed)
Pt has a ride home.  

## 2015-10-17 NOTE — ED Notes (Signed)
Per patient, he states he has a filling that came out in right upper back teeth.  Pain is rated a 10

## 2015-10-17 NOTE — Discharge Instructions (Signed)
KEEP YOUR SCHEDULED DENTAL APPOINTMENT FOR FURTHER MANAGEMENT OF MISSING FILLING. TAKE PENICILLIN AS PRESCRIBED. RETURN HERE WITH ANY SEVERE PAIN, FACIAL SWELLING OR HIGH FEVER.  Dental Pain Dental pain may be caused by many things, including:  Tooth decay (cavities or caries). Cavities expose the nerve of your tooth to air and hot or cold temperatures. This can cause pain or discomfort.  Abscess or infection. A dental abscess is a collection of infected pus from a bacterial infection in the inner part of the tooth (pulp). It usually occurs at the end of the tooth's root.  Injury.  An unknown reason (idiopathic). Your pain may be mild or severe. It may only occur when:  You are chewing.  You are exposed to hot or cold temperature.  You are eating or drinking sugary foods or beverages, such as soda or candy. Your pain may also be constant. HOME CARE INSTRUCTIONS Watch your dental pain for any changes. The following actions may help to lessen any discomfort that you are feeling:  Take medicines only as directed by your dentist.  If you were prescribed an antibiotic medicine, finish all of it even if you start to feel better.  Keep all follow-up visits as directed by your dentist. This is important.  Do not apply heat to the outside of your face.  Rinse your mouth or gargle with salt water if directed by your dentist. This helps with pain and swelling.  You can make salt water by adding  tsp of salt to 1 cup of warm water.  Apply ice to the painful area of your face:  Put ice in a plastic bag.  Place a towel between your skin and the bag.  Leave the ice on for 20 minutes, 2-3 times per day.  Avoid foods or drinks that cause you pain, such as:  Very hot or very cold foods or drinks.  Sweet or sugary foods or drinks. SEEK MEDICAL CARE IF:  Your pain is not controlled with medicines.  Your symptoms are worse.  You have new symptoms. SEEK IMMEDIATE MEDICAL CARE  IF:  You are unable to open your mouth.  You are having trouble breathing or swallowing.  You have a fever.  Your face, neck, or jaw is swollen.   This information is not intended to replace advice given to you by your health care provider. Make sure you discuss any questions you have with your health care provider.   Document Released: 07/11/2005 Document Revised: 11/25/2014 Document Reviewed: 07/07/2014 Elsevier Interactive Patient Education [employer]

## 2015-10-17 NOTE — ED Provider Notes (Signed)
CSN: [id number]     Arrival date & time 10/17/15  1859 History  By signing my name below, I, [facility], attest that this documentation has been prepared under the direction and in the presence of Ahmira Boisselle A Belinda Schlichting PA-C,.  Electronically Signed: [name], ED Scribe. 10/17/2015. 8:46 PM.   No chief complaint on file.  The history is provided by the patient. No language interpreter was used.   HPI Comments: Hayden Morris is a 50 y.o. male who presents to the Emergency Department complaining of constant, mild right upper dental pain onset this morning. Patient reports that he was eating this morning when a small black object came out of his mouth that he identified as filling, followed by pain. He has taken Tramadol with slight relief. Per patient, he has a Education officer, community with [employer] and scheduled an appointment in two weeks. Denies facial swelling, fever or difficulty swallowing. No hx of cigarette smoking. He denies any other symptoms currently.  Past Medical History  Diagnosis Date  . Anxiety   . Hypertension   . Anxiety    Past Surgical History  Procedure Laterality Date  . Wrist surgery Left    No family history on file. Social History  Substance Use Topics  . Smoking status: Never Smoker   . Smokeless tobacco: None  . Alcohol Use: No    Review of Systems  Constitutional: Negative for fever.  HENT: Positive for dental problem and facial swelling. Negative for trouble swallowing.   Gastrointestinal: Negative for nausea.  Musculoskeletal: Negative for neck stiffness.      Allergies  Review of patient's allergies indicates no known allergies.  Home Medications   Prior to Admission medications   Medication Sig Start Date End Date Taking? Authorizing Provider  amLODipine (NORVASC) 10 MG tablet Take 10 mg by mouth daily.    Historical Provider, MD  clonazePAM (KLONOPIN) 0.5 MG tablet Take 1 tablet (0.5 mg total) by mouth 2 (two) times daily as  needed for anxiety. 08/14/15   Mancel Bale, MD  clonazePAM (KLONOPIN) 1 MG tablet Take 1 mg by mouth 2 (two) times daily as needed. 06/10/15   Historical Provider, MD  HYDROcodone-acetaminophen (NORCO/VICODIN) 5-325 MG tablet Take 2 tablets by mouth every 4 (four) hours as needed. 10/07/15   Barrett Henle, PA-C  ibuprofen (ADVIL,MOTRIN) 800 MG tablet Take 1 tablet (800 mg total) by mouth 3 (three) times daily. 10/07/15   Barrett Henle, PA-C  loperamide (IMODIUM) 2 MG capsule Take 1 capsule (2 mg total) by mouth 4 (four) times daily as needed for diarrhea or loose stools. 09/29/15   Barrett Henle, PA-C  ondansetron (ZOFRAN) 4 MG tablet Take 1 tablet (4 mg total) by mouth every 6 (six) hours. 09/28/15   Hanna Patel-Mills, PA-C  oxyCODONE-acetaminophen (PERCOCET/ROXICET) 5-325 MG per tablet Take 1-2 tablets by mouth every 6 (six) hours as needed for severe pain. 01/14/15   Junius Finner, PA-C  valsartan (DIOVAN) 80 MG tablet Take 1 tablet (80 mg total) by mouth daily. 11/07/14   Ambrose Finland, NP   BP 143/100 mmHg  Pulse 62  Temp(Src) 98.6 F (37 C) (Oral)  Resp 18  Ht  (1.88 m)  Wt 212 lb (96.163 kg)  BMI 27.21 kg/m2  SpO2 100% Physical Exam  Constitutional: He is oriented to person, place, and time. He appears well-developed and well-nourished. No distress.  HENT:  Head: Normocephalic and atraumatic.  #3 molar has missing central portion of filling. No  surrounding swelling. No facial swelling. Oropharynx benign.   Neck: Neck supple. No tracheal deviation present.  Cardiovascular: Normal rate.   Pulmonary/Chest: Effort normal.  Musculoskeletal: Normal range of motion.  Neurological: He is alert and oriented to person, place, and time.  Skin: Skin is warm and dry.  Psychiatric: He has a normal mood and affect. His behavior is normal.  Nursing note and vitals reviewed.   ED Course  Procedures   DIAGNOSTIC STUDIES: Oxygen Saturation is 100% on RA, normal  by my interpretation.    COORDINATION OF CARE: 8:41 PM Advised pt to continue to take Tramadol and ibuprofen as needed. Will order pain medication. Will prescribe antibiotics. Discussed treatment plan with pt at bedside and pt agreed to plan.   MDM   Final diagnoses:  None    1. Dental pain  No evidence abscess but will cover exposed dentition with pcn. Offered pain relief here and encouraged continued use of Ultram at home. Patient aware of plan and agreeable.   I personally performed the services described in this documentation, which was scribed in my presence. The recorded information has been reviewed and is accurate.     Elpidio AnisShari Lyllian Gause, PA-C 10/17/15 2222  Lorre NickAnthony Allen, MD 10/17/15 [phone]

## 2015-11-02 ENCOUNTER — Encounter (HOSPITAL_COMMUNITY): Payer: Self-pay | Admitting: Emergency Medicine

## 2015-11-02 ENCOUNTER — Emergency Department (HOSPITAL_COMMUNITY): Payer: Managed Care, Other (non HMO)

## 2015-11-02 ENCOUNTER — Emergency Department (HOSPITAL_COMMUNITY)
Admission: EM | Admit: 2015-11-02 | Discharge: 2015-11-02 | Disposition: A | Discharge status: Home / Self Care | Payer: Managed Care, Other (non HMO) | Attending: Emergency Medicine | Admitting: Emergency Medicine

## 2015-11-02 DIAGNOSIS — I10 Essential (primary) hypertension: Secondary | ICD-10-CM | POA: Diagnosis not present

## 2015-11-02 DIAGNOSIS — Z792 Long term (current) use of antibiotics: Secondary | ICD-10-CM | POA: Diagnosis not present

## 2015-11-02 DIAGNOSIS — F419 Anxiety disorder, unspecified: Secondary | ICD-10-CM | POA: Diagnosis not present

## 2015-11-02 DIAGNOSIS — Z79899 Other long term (current) drug therapy: Secondary | ICD-10-CM | POA: Diagnosis not present

## 2015-11-02 DIAGNOSIS — M25512 Pain in left shoulder: Secondary | ICD-10-CM

## 2015-11-02 MED ORDER — NAPROXEN 500 MG PO TABS: 500.0000 mg | ORAL_TABLET | Freq: Two times a day (BID) | ORAL | Status: DC

## 2015-11-02 MED ORDER — CYCLOBENZAPRINE HCL 10 MG PO TABS: 10.0000 mg | ORAL_TABLET | Freq: Two times a day (BID) | ORAL | Status: AC | PRN

## 2015-11-02 NOTE — Discharge Instructions (Signed)
Shoulder Pain The shoulder is the joint that connects your arms to your body. The bones that form the shoulder joint include the upper arm bone (humerus), the shoulder blade (scapula), and the collarbone (clavicle). The top of the humerus is shaped like a ball and fits into a rather flat socket on the scapula (glenoid cavity). A combination of muscles and strong, fibrous tissues that connect muscles to bones (tendons) support your shoulder joint and hold the ball in the socket. Small, fluid-filled sacs (bursae) are located in different areas of the joint. They act as cushions between the bones and the overlying soft tissues and help reduce friction between the gliding tendons and the bone as you move your arm. Your shoulder joint allows a wide range of motion in your arm. This range of motion allows you to do things like scratch your back or throw a ball. However, this range of motion also makes your shoulder more prone to pain from overuse and injury. Causes of shoulder pain can originate from both injury and overuse and usually can be grouped in the following four categories:  Redness, swelling, and pain (inflammation) of the tendon (tendinitis) or the bursae (bursitis).  Instability, such as a dislocation of the joint.  Inflammation of the joint (arthritis).  Broken bone (fracture). HOME CARE INSTRUCTIONS   Apply ice to the sore area.  Put ice in a plastic bag.  Place a towel between your skin and the bag.  Leave the ice on for 15-20 minutes, 3-4 times per day for the first 2 days, or as directed by your health care provider.  Stop using cold packs if they do not help with the pain.  If you have a shoulder sling or immobilizer, wear it as long as your caregiver instructs. Only remove it to shower or bathe. Move your arm as little as possible, but keep your hand moving to prevent swelling.  Squeeze a soft ball or foam pad as much as possible to help prevent swelling.  Only take  over-the-counter or prescription medicines for pain, discomfort, or fever as directed by your caregiver. SEEK MEDICAL CARE IF:   Your shoulder pain increases, or new pain develops in your arm, hand, or fingers.  Your hand or fingers become cold and numb.  Your pain is not relieved with medicines. SEEK IMMEDIATE MEDICAL CARE IF:   Your arm, hand, or fingers are numb or tingling.  Your arm, hand, or fingers are significantly swollen or turn white or blue. MAKE SURE YOU:   Understand these instructions.  Will watch your condition.  Will get help right away if you are not doing well or get worse.   This information is not intended to replace advice given to you by your health care provider. Make sure you discuss any questions you have with your health care provider.  Follow up with orthopedic provider if your symptoms do not improve. Keep arm in sling. Apply ice to affected area. Return to the ED if you experience severe worsening of your symptoms, weakness, redness or swelling around shoulder, inability to move extremities.

## 2015-11-02 NOTE — ED Notes (Signed)
Patient transported to X-ray 

## 2015-11-02 NOTE — ED Notes (Signed)
Patient states was at work this morning and was " jacking up some fabric and the jack slipped and jerked my L shoulder".   Patient complains of pain L shoulder and L trap area.   Patient denies feeling anything rip, tear, pop, burn.   Patient took tramadol when it happed.   Patient states no relief from the tramadol.

## 2015-11-02 NOTE — ED Provider Notes (Signed)
CSN: [id number]     Arrival date & time 11/02/15  1028 History  By signing my name below, I, [name], attest that this documentation has been prepared under the direction and in the presence of Mahima Hottle, PA-C. Electronically Signed: [name], ED Scribe. 11/02/2015. 1:01 PM.    Chief Complaint  Patient presents with  . Shoulder Injury   The history is provided by the patient. No language interpreter was used.    HPI Comments: Yetta FlockJonathan William Bonsell is a 50 y.o. male who presents to the Emergency Department complaining of constant, aching, 9/10 left shoulder pain following a work injury that occurred this morning. Patient states he was "jacking up some fabric, when the jack slipped and jerked [his] left arm downwards." He denies any falls or trauma. Patient notes associated tingling in his fingertips. Raising his arm straight out in front of him exacerbates his pain. Patient states he had taken Tramadol with mild relief. He denies numbness.  Past Medical History  Diagnosis Date  . Anxiety   . Hypertension   . Anxiety    Past Surgical History  Procedure Laterality Date  . Wrist surgery Left    No family history on file. Social History  Substance Use Topics  . Smoking status: Never Smoker   . Smokeless tobacco: None  . Alcohol Use: No    Review of Systems  Musculoskeletal: Positive for arthralgias (left shoulder pain).  Neurological: Negative for numbness.   Allergies  Review of patient's allergies indicates no known allergies.  Home Medications   Prior to Admission medications   Medication Sig Start Date End Date Taking? Authorizing Provider  amLODipine (NORVASC) 10 MG tablet Take 10 mg by mouth daily.   Yes Historical Provider, MD  clonazePAM (KLONOPIN) 0.5 MG tablet Take 1 tablet (0.5 mg total) by mouth 2 (two) times daily as needed for anxiety. 08/14/15  Yes Mancel BaleElliott Wentz, MD  clonazePAM (KLONOPIN) 1 MG tablet Take 1 mg by mouth 2 (two) times daily as needed.  06/10/15  Yes Historical Provider, MD  ibuprofen (ADVIL,MOTRIN) 800 MG tablet Take 1 tablet (800 mg total) by mouth 3 (three) times daily. 10/07/15  Yes Barrett HenleNicole Elizabeth Nadeau, PA-C  HYDROcodone-acetaminophen (NORCO/VICODIN) 5-325 MG tablet Take 2 tablets by mouth every 4 (four) hours as needed. 10/07/15   Barrett HenleNicole Elizabeth Nadeau, PA-C  loperamide (IMODIUM) 2 MG capsule Take 1 capsule (2 mg total) by mouth 4 (four) times daily as needed for diarrhea or loose stools. 09/29/15   Barrett HenleNicole Elizabeth Nadeau, PA-C  ondansetron (ZOFRAN) 4 MG tablet Take 1 tablet (4 mg total) by mouth every 6 (six) hours. 09/28/15   Hanna Patel-Mills, PA-C  oxyCODONE-acetaminophen (PERCOCET/ROXICET) 5-325 MG per tablet Take 1-2 tablets by mouth every 6 (six) hours as needed for severe pain. 01/14/15   Junius FinnerErin O'Malley, PA-C  penicillin v potassium (VEETID) 500 MG tablet Take 1 tablet (500 mg total) by mouth 3 (three) times daily. 10/17/15   Elpidio AnisShari Upstill, PA-C  valsartan (DIOVAN) 80 MG tablet Take 1 tablet (80 mg total) by mouth daily. 11/07/14   Ambrose FinlandValerie A Keck, NP   BP 147/112 mmHg  Pulse 73  Temp(Src) 97.8 F (36.6 C) (Oral)  Resp 18  SpO2 100% Physical Exam  Constitutional: He is oriented to person, place, and time. He appears well-developed and well-nourished. No distress.  HENT:  Head: Normocephalic and atraumatic.  Eyes: Conjunctivae are normal. Right eye exhibits no discharge. Left eye exhibits no discharge. No scleral icterus.  Cardiovascular: Normal  rate.   Pulmonary/Chest: Effort normal.  Musculoskeletal: He exhibits tenderness.  Left shoulder: Negative Hawkins' test. Positive Neer's. Pain felt with abduction. No obvious AC joint separation or step-offs.   Neurological: He is alert and oriented to person, place, and time. Coordination normal.  Skin: Skin is warm and dry. No rash noted. He is not diaphoretic. No erythema. No pallor.  Psychiatric: He has a normal mood and affect. His behavior is normal.  Nursing note  and vitals reviewed.   ED Course  Procedures (including critical care time)  DIAGNOSTIC STUDIES: Oxygen Saturation is 100% on RA, normal by my interpretation.    COORDINATION OF CARE: 10:52 AM - Discussed treatment plan with pt at bedside which includes left shoulder XR. Discussed with pt that he will likely need follow-up with an orthopedist to rule out shoulder impingement. Pt verbalized understanding and agreed to plan.   Imaging Review Dg Shoulder Left  11/02/2015  CLINICAL DATA:  Left shoulder injury at work today. Jammed left arm. Shoulder pain. EXAM: LEFT SHOULDER - 2+ VIEW COMPARISON:  None. FINDINGS: No acute bony abnormality. Specifically, no fracture, subluxation, or dislocation. Soft tissues are intact. IMPRESSION: No acute bony abnormality. Electronically Signed   By: Charlett Nose M.D.   On: 11/02/2015 12:16   I have personally reviewed and evaluated these images and lab results as part of my medical decision-making.  MDM   Final diagnoses:  Left shoulder pain   Patient X-Ray negative for obvious fracture or dislocation. Pain managed in ED. Pt advised to follow up with orthopedics if symptoms persist for possibility of missed fracture diagnosis. Patient given brace while in ED, conservative therapy recommended and discussed. Patient will be dc home & is agreeable with above plan.   I personally performed the services described in this documentation, which was scribed in my presence. The recorded information has been reviewed and is accurate.       Lester Kinsman Benton, PA-C 11/04/15 0912  Richardean Canal, MD 11/04/15 1126

## 2015-11-15 ENCOUNTER — Emergency Department (HOSPITAL_COMMUNITY)
Admission: EM | Admit: 2015-11-15 | Discharge: 2015-11-15 | Disposition: A | Discharge status: Home / Self Care | Payer: Managed Care, Other (non HMO) | Attending: Emergency Medicine | Admitting: Emergency Medicine

## 2015-11-15 ENCOUNTER — Encounter (HOSPITAL_COMMUNITY): Payer: Self-pay

## 2015-11-15 DIAGNOSIS — Z760 Encounter for issue of repeat prescription: Secondary | ICD-10-CM | POA: Diagnosis not present

## 2015-11-15 DIAGNOSIS — R1084 Generalized abdominal pain: Secondary | ICD-10-CM | POA: Diagnosis present

## 2015-11-15 DIAGNOSIS — Z791 Long term (current) use of non-steroidal anti-inflammatories (NSAID): Secondary | ICD-10-CM | POA: Diagnosis not present

## 2015-11-15 DIAGNOSIS — Z79899 Other long term (current) drug therapy: Secondary | ICD-10-CM | POA: Insufficient documentation

## 2015-11-15 DIAGNOSIS — F419 Anxiety disorder, unspecified: Secondary | ICD-10-CM | POA: Insufficient documentation

## 2015-11-15 DIAGNOSIS — I10 Essential (primary) hypertension: Secondary | ICD-10-CM | POA: Insufficient documentation

## 2015-11-15 DIAGNOSIS — Z792 Long term (current) use of antibiotics: Secondary | ICD-10-CM | POA: Insufficient documentation

## 2015-11-15 DIAGNOSIS — R197 Diarrhea, unspecified: Secondary | ICD-10-CM | POA: Diagnosis not present

## 2015-11-15 LAB — URINALYSIS, ROUTINE W REFLEX MICROSCOPIC
Bilirubin Urine: NEGATIVE
Glucose, UA: NEGATIVE mg/dL
Hgb urine dipstick: NEGATIVE
Ketones, ur: NEGATIVE mg/dL
LEUKOCYTES UA: NEGATIVE
NITRITE: NEGATIVE
PH: 5 (ref 5.0–8.0)
Protein, ur: NEGATIVE mg/dL
SPECIFIC GRAVITY, URINE: 1.02 (ref 1.005–1.030)

## 2015-11-15 LAB — COMPREHENSIVE METABOLIC PANEL
ALBUMIN: 3.9 g/dL (ref 3.5–5.0)
ALK PHOS: 80 U/L (ref 38–126)
ALT: 56 U/L (ref 17–63)
ANION GAP: 7 (ref 5–15)
AST: 32 U/L (ref 15–41)
BUN: 12 mg/dL (ref 6–20)
CO2: 24 mmol/L (ref 22–32)
Calcium: 9.6 mg/dL (ref 8.9–10.3)
Chloride: 106 mmol/L (ref 101–111)
Creatinine, Ser: 1.09 mg/dL (ref 0.61–1.24)
GFR calc Af Amer: >60 mL/min (ref >60)
GFR calc non Af Amer: >60 mL/min (ref >60)
GLUCOSE: 100 mg/dL — AB (ref 65–99)
Potassium: 4.4 mmol/L (ref 3.5–5.1)
SODIUM: 137 mmol/L (ref 135–145)
Total Bilirubin: 0.5 mg/dL (ref 0.3–1.2)
Total Protein: 8.9 g/dL — ABNORMAL HIGH (ref 6.5–8.1)

## 2015-11-15 LAB — CBC WITH DIFFERENTIAL/PLATELET
BASOS ABS: 0 10*3/uL (ref 0.0–0.1)
BASOS PCT: 0 %
EOS ABS: 0.2 10*3/uL (ref 0.0–0.7)
Eosinophils Relative: 3 %
HCT: 45.4 % (ref 39.0–52.0)
HEMOGLOBIN: 15 g/dL (ref 13.0–17.0)
Lymphocytes Relative: 33 %
Lymphs Abs: 2.3 10*3/uL (ref 0.7–4.0)
MCH: 29.3 pg (ref 26.0–34.0)
MCHC: 33 g/dL (ref 30.0–36.0)
MCV: 88.7 fL (ref 78.0–100.0)
MONOS PCT: 8 %
Monocytes Absolute: 0.6 10*3/uL (ref 0.1–1.0)
NEUTROS PCT: 56 %
Neutro Abs: 4 10*3/uL (ref 1.7–7.7)
Platelets: 380 10*3/uL (ref 150–400)
RBC: 5.12 MIL/uL (ref 4.22–5.81)
RDW: 12.9 % (ref 11.5–15.5)
WBC: 7.2 10*3/uL (ref 4.0–10.5)

## 2015-11-15 LAB — LIPASE, BLOOD: Lipase: 26 U/L (ref 11–51)

## 2015-11-15 MED ORDER — CLONAZEPAM 1 MG PO TABS: 1.0000 mg | ORAL_TABLET | Freq: Three times a day (TID) | ORAL | Status: AC | PRN

## 2015-11-15 NOTE — Discharge Instructions (Signed)
Abdominal Pain, Adult Many things can cause abdominal pain. Usually, abdominal pain is not caused by a disease and will improve without treatment. It can often be observed and treated at home. Your health care provider will do a physical exam and possibly order blood tests and X-rays to help determine the seriousness of your pain. However, in many cases, more time must pass before a clear cause of the pain can be found. Before that point, your health care provider may not know if you need more testing or further treatment. HOME CARE INSTRUCTIONS Monitor your abdominal pain for any changes. The following actions may help to alleviate any discomfort you are experiencing:  Only take over-the-counter or prescription medicines as directed by your health care provider.  Do not take laxatives unless directed to do so by your health care provider.  Try a clear liquid diet (broth, tea, or water) as directed by your health care provider. Slowly move to a bland diet as tolerated. SEEK MEDICAL CARE IF:  You have unexplained abdominal pain.  You have abdominal pain associated with nausea or diarrhea.  You have pain when you urinate or have a bowel movement.  You experience abdominal pain that wakes you in the night.  You have abdominal pain that is worsened or improved by eating food.  You have abdominal pain that is worsened with eating fatty foods.  You have a fever. SEEK IMMEDIATE MEDICAL CARE IF:  Your pain does not go away within 2 hours.  You keep throwing up (vomiting).  Your pain is felt only in portions of the abdomen, such as the right side or the left lower portion of the abdomen.  You pass bloody or black tarry stools. MAKE SURE YOU:  Understand these instructions.  Will watch your condition.  Will get help right away if you are not doing well or get worse.   This information is not intended to replace advice given to you by your health care provider. Make sure you discuss  any questions you have with your health care provider.   Document Released: 04/20/2005 Document Revised: 04/01/2015 Document Reviewed: 03/20/2013 Elsevier Interactive Patient Education [employer]  Medicine Refill at the Emergency Department We have refilled your medicine today, but it is best for you to get refills through your primary health care provider's office. In the future, please plan ahead so you do not need to get refills from the emergency department. If the medicine we refilled was a maintenance medicine, you may have received only enough to get you by until you are able to see your regular health care provider.   This information is not intended to replace advice given to you by your health care provider. Make sure you discuss any questions you have with your health care provider.   Document Released: 10/28/2003 Document Revised: 08/01/2014 Document Reviewed: 10/18/2013 Elsevier Interactive Patient Education [employer]

## 2015-11-15 NOTE — ED Provider Notes (Signed)
CSN: [id number]     Arrival date & time 11/15/15  1349 History   First MD Initiated Contact with Patient 11/15/15 1503     Chief Complaint  Patient presents with  . Abdominal Pain  . Diarrhea     (Consider location/radiation/quality/duration/timing/severity/associated sxs/prior Treatment) HPI Patient presents with generalized abdominal discomfort for the past 6 days. Associated with intermittent loose stools but no nausea or vomiting. States he ran out of his Klonopin at the same time. He's been using it when necessary for anxiety for the past 5 months. States he is not taking it every day. Denies tremors or hallucinations. Patient states he does have increased anxiety and this exacerbates his abdominal discomfort. He's had no urinary symptoms, fever or chills. He has appointment to see his primary physician for medication refill on Friday. Past Medical History  Diagnosis Date  . Anxiety   . Hypertension   . Anxiety    Past Surgical History  Procedure Laterality Date  . Wrist surgery Left    History reviewed. No pertinent family history. Social History  Substance Use Topics  . Smoking status: Never Smoker   . Smokeless tobacco: None  . Alcohol Use: No    Review of Systems  Constitutional: Negative for fever and chills.  Respiratory: Negative for shortness of breath.   Cardiovascular: Negative for chest pain.  Gastrointestinal: Positive for abdominal pain and diarrhea. Negative for nausea, vomiting, constipation and blood in stool.  Genitourinary: Negative for dysuria, urgency, frequency, hematuria and flank pain.  Musculoskeletal: Negative for myalgias, back pain, neck pain and neck stiffness.  Skin: Negative for rash and wound.  Neurological: Negative for dizziness, tremors, syncope, weakness, light-headedness, numbness and headaches.  All other systems reviewed and are negative.     Allergies  Review of patient's allergies indicates no known allergies.  Home  Medications   Prior to Admission medications   Medication Sig Start Date End Date Taking? Authorizing Provider  amLODipine (NORVASC) 10 MG tablet Take 10 mg by mouth daily.   Yes Historical Provider, MD  clonazePAM (KLONOPIN) 0.5 MG tablet Take 1 tablet (0.5 mg total) by mouth 2 (two) times daily as needed for anxiety. 08/14/15  Yes Mancel BaleElliott Wentz, MD  COLCRYS 0.6 MG tablet Take 0.6 mg by mouth daily. 11/09/15  Yes Historical Provider, MD  HYDROcodone-acetaminophen (NORCO/VICODIN) 5-325 MG tablet Take 2 tablets by mouth every 4 (four) hours as needed. 10/07/15  Yes Barrett HenleNicole Elizabeth Nadeau, PA-C  ibuprofen (ADVIL,MOTRIN) 800 MG tablet Take 1 tablet (800 mg total) by mouth 3 (three) times daily. 10/07/15  Yes Satira SarkNicole Elizabeth Nadeau, PA-C  ondansetron (ZOFRAN) 4 MG tablet Take 1 tablet (4 mg total) by mouth every 6 (six) hours. 09/28/15  Yes Hanna Patel-Mills, PA-C  penicillin v potassium (VEETID) 500 MG tablet Take 1 tablet (500 mg total) by mouth 3 (three) times daily. 10/17/15  Yes Shari Upstill, PA-C  clonazePAM (KLONOPIN) 1 MG tablet Take 1 tablet (1 mg total) by mouth 3 (three) times daily as needed for anxiety. 11/15/15   Loren Raceravid Jai Steil, MD  cyclobenzaprine (FLEXERIL) 10 MG tablet Take 1 tablet (10 mg total) by mouth 2 (two) times daily as needed for muscle spasms. Patient not taking: Reported on 11/15/2015 11/02/15   Samantha Tripp Dowless, PA-C  loperamide (IMODIUM) 2 MG capsule Take 1 capsule (2 mg total) by mouth 4 (four) times daily as needed for diarrhea or loose stools. Patient not taking: Reported on 11/15/2015 09/29/15   Barrett HenleNicole Elizabeth Nadeau, PA-C  naproxen (  NAPROSYN) 500 MG tablet Take 1 tablet (500 mg total) by mouth 2 (two) times daily. Patient not taking: Reported on 11/15/2015 11/02/15   Samantha Tripp Dowless, PA-C  oxyCODONE-acetaminophen (PERCOCET/ROXICET) 5-325 MG per tablet Take 1-2 tablets by mouth every 6 (six) hours as needed for severe pain. Patient not taking: Reported on  11/15/2015 01/14/15   Junius Finner, PA-C  valsartan (DIOVAN) 80 MG tablet Take 1 tablet (80 mg total) by mouth daily. Patient not taking: Reported on 11/15/2015 11/07/14   Ambrose Finland, NP   BP 127/97 mmHg  Pulse 94  Temp(Src) 97.8 F (36.6 C) (Oral)  Resp 16  SpO2 99% Physical Exam  Constitutional: He is oriented to person, place, and time. He appears well-developed and well-nourished. No distress.  HENT:  Head: Normocephalic and atraumatic.  Mouth/Throat: Oropharynx is clear and moist. No oropharyngeal exudate.  Eyes: EOM are normal. Pupils are equal, round, and reactive to light.  Neck: Normal range of motion. Neck supple.  Cardiovascular: Normal rate and regular rhythm.   Pulmonary/Chest: Effort normal and breath sounds normal. No respiratory distress. He has no wheezes. He has no rales. He exhibits no tenderness.  Abdominal: Soft. Bowel sounds are normal. He exhibits no distension and no mass. There is no tenderness. There is no rebound and no guarding.  Musculoskeletal: Normal range of motion. He exhibits no edema or tenderness.  No CVA tenderness bilaterally.  Neurological: He is alert and oriented to person, place, and time.  Patient is alert and oriented x3 with clear, goal oriented speech. Patient has 5/5 motor in all extremities. Sensation is intact to light touch. Patient has a normal gait and walks without assistance. No tremor noted.  Skin: Skin is warm and dry. No rash noted. No erythema.  Psychiatric: He has a normal mood and affect. His behavior is normal.  Nursing note and vitals reviewed.   ED Course  Procedures (including critical care time) Labs Review Labs Reviewed  COMPREHENSIVE METABOLIC PANEL - Abnormal; Notable for the following:    Glucose, Bld 100 (*)    Total Protein 8.9 (*)    All other components within normal limits  CBC WITH DIFFERENTIAL/PLATELET  URINALYSIS, ROUTINE W REFLEX MICROSCOPIC (NOT AT [facility])  LIPASE, BLOOD    Imaging Review No  results found. I have personally reviewed and evaluated these images and lab results as part of my medical decision-making.   EKG Interpretation None      MDM   Final diagnoses:  Medication refill  Generalized abdominal pain   Abdominal exam is benign. We'll give prescription for several days of clonazepam until patient can be followed up by his primary physician. No definite evidence of benzodiazepine withdrawal. Return precautions given.     Loren Racer, MD 11/15/15 [phone]

## 2015-11-15 NOTE — ED Notes (Signed)
Pt c/o generalized abdominal pain and "a little" diarrhea x 3 days.  Pain score 4/10.  Pt reports that he ran out of Klonopin x 4 days ago and thinks it is related.  Pt reports f/u w/ PCP on 4/28.

## 2016-06-05 ENCOUNTER — Ambulatory Visit (HOSPITAL_COMMUNITY)
Admission: EM | Admit: 2016-06-05 | Discharge: 2016-06-05 | Disposition: A | Discharge status: Home / Self Care | Payer: Self-pay | Attending: Family Medicine | Admitting: Family Medicine

## 2016-06-05 ENCOUNTER — Encounter (HOSPITAL_COMMUNITY): Payer: Self-pay

## 2016-06-05 DIAGNOSIS — K047 Periapical abscess without sinus: Secondary | ICD-10-CM

## 2016-06-05 DIAGNOSIS — I10 Essential (primary) hypertension: Secondary | ICD-10-CM

## 2016-06-05 DIAGNOSIS — R51 Headache: Secondary | ICD-10-CM

## 2016-06-05 DIAGNOSIS — R220 Localized swelling, mass and lump, head: Secondary | ICD-10-CM

## 2016-06-05 DIAGNOSIS — R519 Headache, unspecified: Secondary | ICD-10-CM

## 2016-06-05 DIAGNOSIS — R6884 Jaw pain: Secondary | ICD-10-CM

## 2016-06-05 MED ORDER — PENICILLIN V POTASSIUM 500 MG PO TABS: 500.0000 mg | ORAL_TABLET | Freq: Four times a day (QID) | ORAL | 0 refills | Status: DC

## 2016-06-05 MED ORDER — NAPROXEN 500 MG PO TABS: 500.0000 mg | ORAL_TABLET | Freq: Two times a day (BID) | ORAL | 0 refills | Status: AC

## 2016-06-05 MED ORDER — PENICILLIN V POTASSIUM 500 MG PO TABS: 500.0000 mg | ORAL_TABLET | Freq: Four times a day (QID) | ORAL | 0 refills | Status: AC

## 2016-06-05 NOTE — ED Triage Notes (Signed)
Pt reports headache for 2 days and having ringing in his left ear and radiates to his left jaw and face. Has taken tramadol and ibuprofen 800mg . Said his left side of his face is swollen.

## 2016-06-05 NOTE — ED Provider Notes (Signed)
[facility]    CSN: [id number] Arrival date & time: 06/05/16  1728     History   Chief Complaint Chief Complaint  Patient presents with  . Headache    HPI Hayden Morris is a 50 y.o. male.   The history is provided by the patient. No language interpreter was used.    Jaw pain:C/O pain of his left jaw for two days causing headache. Pain is 10/10 in severity, aching in nature, triggered by talking or moving his jaw. Pain radiates to his left ear and head. Denies any facial injury, he endorsed tooth ache in that area. He had tooth problem long time again but this is worse. He has associated jaw swelling. Pain is bad enough to prevent sleep. He Ibuprofen and tramadol with no improvement. HTN: Out of Norvasc for 3 days. He has prescription to pick up at the pharmacy.   Past Medical History:  Diagnosis Date  . Anxiety   . Anxiety   . Hypertension     Patient Active Problem List   Diagnosis Date Noted  . Essential hypertension 11/09/2014    Past Surgical History:  Procedure Laterality Date  . WRIST SURGERY Left        Home Medications    Prior to Admission medications   Medication Sig Start Date End Date Taking? Authorizing Provider  amLODipine (NORVASC) 10 MG tablet Take 10 mg by mouth daily.   Yes Historical Provider, MD  clonazePAM (KLONOPIN) 1 MG tablet Take 1 tablet (1 mg total) by mouth 3 (three) times daily as needed for anxiety. 11/15/15  Yes Loren Raceravid Yelverton, MD  COLCRYS 0.6 MG tablet Take 0.6 mg by mouth daily. 11/09/15   Historical Provider, MD  cyclobenzaprine (FLEXERIL) 10 MG tablet Take 1 tablet (10 mg total) by mouth 2 (two) times daily as needed for muscle spasms. Patient not taking: Reported on 11/15/2015 11/02/15   Samantha Tripp Dowless, PA-C  HYDROcodone-acetaminophen (NORCO/VICODIN) 5-325 MG tablet Take 2 tablets by mouth every 4 (four) hours as needed. 10/07/15   Barrett HenleNicole Elizabeth Nadeau, PA-C  ibuprofen (ADVIL,MOTRIN) 800 MG tablet  Take 1 tablet (800 mg total) by mouth 3 (three) times daily. 10/07/15   Barrett HenleNicole Elizabeth Nadeau, PA-C  loperamide (IMODIUM) 2 MG capsule Take 1 capsule (2 mg total) by mouth 4 (four) times daily as needed for diarrhea or loose stools. Patient not taking: Reported on 11/15/2015 09/29/15   Barrett HenleNicole Elizabeth Nadeau, PA-C  naproxen (NAPROSYN) 500 MG tablet Take 1 tablet (500 mg total) by mouth 2 (two) times daily. Patient not taking: Reported on 11/15/2015 11/02/15   Samantha Tripp Dowless, PA-C  ondansetron (ZOFRAN) 4 MG tablet Take 1 tablet (4 mg total) by mouth every 6 (six) hours. 09/28/15   Hanna Patel-Mills, PA-C  oxyCODONE-acetaminophen (PERCOCET/ROXICET) 5-325 MG per tablet Take 1-2 tablets by mouth every 6 (six) hours as needed for severe pain. Patient not taking: Reported on 11/15/2015 01/14/15   Junius FinnerErin O'Malley, PA-C  penicillin v potassium (VEETID) 500 MG tablet Take 1 tablet (500 mg total) by mouth 3 (three) times daily. 10/17/15   Elpidio AnisShari Upstill, PA-C  valsartan (DIOVAN) 80 MG tablet Take 1 tablet (80 mg total) by mouth daily. Patient not taking: Reported on 11/15/2015 11/07/14   Ambrose FinlandValerie A Keck, NP    Family History No family history on file.  Social History Social History  Substance Use Topics  . Smoking status: Never Smoker  . Smokeless tobacco: Never Used  . Alcohol use No  Allergies   Patient has no known allergies.   Review of Systems Review of Systems  Constitutional: Negative for fever.  HENT: Positive for ear pain.        Facial pain, left jaw swelling  Eyes: Negative for visual disturbance.  Respiratory: Negative.   Cardiovascular: Negative.   Gastrointestinal: Negative.   Neurological: Positive for headaches.  All other systems reviewed and are negative.    Physical Exam Triage Vital Signs ED Triage Vitals  Enc Vitals Group     BP 06/05/16 1830 (!) 166/101     Pulse Rate 06/05/16 1830 87     Resp 06/05/16 1830 16     Temp 06/05/16 1830 98.5 F (36.9 C)      Temp Source 06/05/16 1830 Oral     SpO2 06/05/16 1830 98 %     Weight --      Height 06/05/16 1832 6\' 2"  (1.88 m)     Head Circumference --      Peak Flow --      Pain Score 06/05/16 1834 10     Pain Loc --      Pain Edu? --      Excl. in GC? --    No data found.   Updated Vital Signs BP (!) 166/101 (BP Location: Left Arm)   Pulse 87   Temp 98.5 F (36.9 C) (Oral)   Resp 16   Ht 6\' 2"  (1.88 m)   SpO2 98%   Visual Acuity Right Eye Distance:   Left Eye Distance:   Bilateral Distance:    Right Eye Near:   Left Eye Near:    Bilateral Near:     Physical Exam  Constitutional: He appears well-developed. No distress.  HENT:  Head:    Right Ear: Tympanic membrane normal.  Left Ear: Tympanic membrane normal.  Mouth/Throat: Uvula is midline and oropharynx is clear and moist.    Cardiovascular: Normal rate, regular rhythm and normal heart sounds.   No murmur heard. Pulmonary/Chest: Effort normal and breath sounds normal. No respiratory distress. He has no wheezes.  Nursing note and vitals reviewed.    UC Treatments / Results  Labs (all labs ordered are listed, but only abnormal results are displayed) Labs Reviewed - No data to display  EKG  EKG Interpretation None       Radiology No results found.  Procedures Procedures (including critical care time)  Medications Ordered in UC Medications - No data to display   Initial Impression / Assessment and Plan / UC Course  I have reviewed the triage vital signs and the nursing notes.  Pertinent labs & imaging results that were available during my care of the patient were reviewed by me and considered in my medical decision making (see chart for details).  Clinical Course as of Jun 05 1924  Wynelle Link Jun 05, 2016  4098 Likely has dental infection. This is reoccurring for him. Penicillin V prescribed for 7 days. Naproxen prn pain. Advised to see dentist as soon as possible. He agreed with plan.  Naproxen prn  jaw pain and headache.  F/U as needed.  [KE]  1925 For HTN. He is to pick up his Norvasc and restart as soon as possible.  [KE]    Clinical Course User Index [KE] Doreene Eland, MD     Final Clinical Impressions(s) / UC Diagnoses   Final diagnoses:  None   Dental infection  Nonintractable headache, unspecified chronicity pattern, unspecified headache type  Jaw pain  Jaw swelling  Essential hypertension   New Prescriptions New Prescriptions   No medications on file     Doreene ElandKehinde T Eniola, MD 06/05/16 1927

## 2016-06-05 NOTE — Discharge Instructions (Signed)
It was nice seeing you today. It seems you having tooth infection. Please start Penicillin. Also use Naproxen for pain. Do not use with Ibuprofen. See your dentist soon.

## 2016-07-06 ENCOUNTER — Emergency Department (HOSPITAL_COMMUNITY): Payer: Self-pay

## 2016-07-06 ENCOUNTER — Encounter (HOSPITAL_COMMUNITY): Payer: Self-pay | Admitting: *Deleted

## 2016-07-06 ENCOUNTER — Emergency Department (HOSPITAL_COMMUNITY)
Admission: EM | Admit: 2016-07-06 | Discharge: 2016-07-06 | Disposition: A | Discharge status: Home / Self Care | Payer: Self-pay | Attending: Emergency Medicine | Admitting: Emergency Medicine

## 2016-07-06 DIAGNOSIS — W231XXA Caught, crushed, jammed, or pinched between stationary objects, initial encounter: Secondary | ICD-10-CM | POA: Insufficient documentation

## 2016-07-06 DIAGNOSIS — Y929 Unspecified place or not applicable: Secondary | ICD-10-CM | POA: Insufficient documentation

## 2016-07-06 DIAGNOSIS — Y9389 Activity, other specified: Secondary | ICD-10-CM | POA: Insufficient documentation

## 2016-07-06 DIAGNOSIS — S60041A Contusion of right ring finger without damage to nail, initial encounter: Secondary | ICD-10-CM | POA: Insufficient documentation

## 2016-07-06 DIAGNOSIS — I10 Essential (primary) hypertension: Secondary | ICD-10-CM | POA: Insufficient documentation

## 2016-07-06 DIAGNOSIS — Y999 Unspecified external cause status: Secondary | ICD-10-CM | POA: Insufficient documentation

## 2016-07-06 DIAGNOSIS — S60221A Contusion of right hand, initial encounter: Secondary | ICD-10-CM

## 2016-07-06 MED ORDER — TRAMADOL HCL 50 MG PO TABS: 50.0000 mg | ORAL_TABLET | Freq: Four times a day (QID) | ORAL | 0 refills | Status: AC | PRN

## 2016-07-06 MED ORDER — IBUPROFEN 800 MG PO TABS: 800.0000 mg | ORAL_TABLET | Freq: Three times a day (TID) | ORAL | 0 refills | Status: AC | PRN

## 2016-07-06 NOTE — Discharge Instructions (Signed)
Return here as needed. Follow up with the orthopedist as needed.

## 2016-07-06 NOTE — ED Triage Notes (Signed)
Pt reports injuring right ring finger today with decreased rom.

## 2016-07-06 NOTE — ED Notes (Signed)
Patient transported to X-ray 

## 2016-07-06 NOTE — ED Provider Notes (Signed)
[facility] Provider Note   CSN: [id number] Arrival date & time: 07/06/16  1808   By signing my name below, I, Freida Busmaniana Omoyeni, attest that this documentation has been prepared under the direction and in the presence of Eli Lilly and CompanyChristopher Tinamarie Przybylski, PA-C. Electronically Signed: Freida Busmaniana Omoyeni, Scribe. 07/06/2016. 8:27 PM.  History   Chief Complaint Chief Complaint  Patient presents with  . Finger Injury    The history is provided by the patient. No language interpreter was used.     HPI Comments:  Hayden FlockJonathan William Morris is a 50 y.o. male who presents to the Emergency Department complaining of 5/10 pain to the right ring finger s/p injury today. He was playing ball with kids and jammed the finger. He denies wrist pain. No alleviating factors noted. He has no other acute complaints or injuries at this time.    Past Medical History:  Diagnosis Date  . Anxiety   . Anxiety   . Hypertension     Patient Active Problem List   Diagnosis Date Noted  . Essential hypertension 11/09/2014    Past Surgical History:  Procedure Laterality Date  . WRIST SURGERY Left        Home Medications    Prior to Admission medications   Medication Sig Start Date End Date Taking? Authorizing Provider  amLODipine (NORVASC) 10 MG tablet Take 10 mg by mouth daily.    Historical Provider, MD  clonazePAM (KLONOPIN) 1 MG tablet Take 1 tablet (1 mg total) by mouth 3 (three) times daily as needed for anxiety. 11/15/15   Loren Raceravid Yelverton, MD  COLCRYS 0.6 MG tablet Take 0.6 mg by mouth daily. 11/09/15   Historical Provider, MD  cyclobenzaprine (FLEXERIL) 10 MG tablet Take 1 tablet (10 mg total) by mouth 2 (two) times daily as needed for muscle spasms. Patient not taking: Reported on 11/15/2015 11/02/15   Samantha Tripp Dowless, PA-C  HYDROcodone-acetaminophen (NORCO/VICODIN) 5-325 MG tablet Take 2 tablets by mouth every 4 (four) hours as needed. 10/07/15   Barrett HenleNicole Elizabeth Nadeau, PA-C  ibuprofen (ADVIL,MOTRIN)  800 MG tablet Take 1 tablet (800 mg total) by mouth 3 (three) times daily. 10/07/15   Barrett HenleNicole Elizabeth Nadeau, PA-C  loperamide (IMODIUM) 2 MG capsule Take 1 capsule (2 mg total) by mouth 4 (four) times daily as needed for diarrhea or loose stools. Patient not taking: Reported on 11/15/2015 09/29/15   Barrett HenleNicole Elizabeth Nadeau, PA-C  ondansetron (ZOFRAN) 4 MG tablet Take 1 tablet (4 mg total) by mouth every 6 (six) hours. 09/28/15   Hanna Patel-Mills, PA-C  oxyCODONE-acetaminophen (PERCOCET/ROXICET) 5-325 MG per tablet Take 1-2 tablets by mouth every 6 (six) hours as needed for severe pain. Patient not taking: Reported on 11/15/2015 01/14/15   Junius FinnerErin O'Malley, PA-C  valsartan (DIOVAN) 80 MG tablet Take 1 tablet (80 mg total) by mouth daily. Patient not taking: Reported on 11/15/2015 11/07/14   Ambrose FinlandValerie A Keck, NP    Family History History reviewed. No pertinent family history.  Social History Social History  Substance Use Topics  . Smoking status: Never Smoker  . Smokeless tobacco: Never Used  . Alcohol use No     Allergies   Patient has no known allergies.   Review of Systems Review of Systems  Musculoskeletal: Positive for arthralgias and myalgias.  Neurological: Negative for weakness and numbness.     Physical Exam Updated Vital Signs BP 132/100   Pulse 91   Temp 98.1 F (36.7 C) (Oral)   Resp 18   Ht 6\' 2"  (1.88  m)   Wt 215 lb (97.5 kg)   SpO2 98%   BMI 27.60 kg/m   Physical Exam  Constitutional: He is oriented to person, place, and time. He appears well-developed and well-nourished. No distress.  HENT:  Head: Normocephalic and atraumatic.  Eyes: Pupils are equal, round, and reactive to light.  Pulmonary/Chest: Effort normal.  Musculoskeletal:  Swelling over the MCP of the base of the 4th finger.   Neurological: He is alert and oriented to person, place, and time.  Skin: Skin is warm and dry.  Psychiatric: He has a normal mood and affect.  Nursing note and vitals  reviewed.    ED Treatments / Results  DIAGNOSTIC STUDIES:  Oxygen Saturation is 98% on RA, normal by my interpretation.    COORDINATION OF CARE:  8:26 PM Discussed treatment plan with pt at bedside and pt agreed to plan.  Labs (all labs ordered are listed, but only abnormal results are displayed) Labs Reviewed - No data to display  EKG  EKG Interpretation None       Radiology No results found.  Procedures Procedures (including critical care time)  Medications Ordered in ED Medications - No data to display   Initial Impression / Assessment and Plan / ED Course  I have reviewed the triage vital signs and the nursing notes.  Pertinent labs & imaging results that were available during my care of the patient were reviewed by me and considered in my medical decision making (see chart for details).  Clinical Course       Patient X-Ray negative for obvious fracture or dislocation.  Pt advised to follow up with orthopedics. Patient given ace wrap while in ED, conservative therapy recommended and discussed. Patient will be discharged home & is agreeable with above plan. Returns precautions discussed. Pt appears safe for discharge.  Final Clinical Impressions(s) / ED Diagnoses   Final diagnoses:  None    New Prescriptions New Prescriptions   No medications on file  I personally performed the services described in this documentation, which was scribed in my presence. The recorded information has been reviewed and is accurate.   Charlestine NightChristopher Eivin Mascio, PA-C 07/06/16 2059    Nira ConnPedro Eduardo Cardama, MD 07/07/16 1258

## 2016-12-30 ENCOUNTER — Emergency Department (HOSPITAL_COMMUNITY)
Admission: EM | Admit: 2016-12-30 | Discharge: 2016-12-30 | Disposition: A | Discharge status: Home / Self Care | Payer: 59 | Attending: Emergency Medicine | Admitting: Emergency Medicine

## 2016-12-30 ENCOUNTER — Encounter (HOSPITAL_COMMUNITY): Payer: Self-pay | Admitting: Emergency Medicine

## 2016-12-30 DIAGNOSIS — I10 Essential (primary) hypertension: Secondary | ICD-10-CM | POA: Diagnosis not present

## 2016-12-30 DIAGNOSIS — M542 Cervicalgia: Secondary | ICD-10-CM | POA: Insufficient documentation

## 2016-12-30 DIAGNOSIS — Z79899 Other long term (current) drug therapy: Secondary | ICD-10-CM | POA: Diagnosis not present

## 2016-12-30 MED ORDER — OXYCODONE-ACETAMINOPHEN 5-325 MG PO TABS
2.0000 | ORAL_TABLET | Freq: Once | ORAL | Status: AC
  Administered 2016-12-30: 2 via ORAL
  Filled 2016-12-30: qty 2

## 2016-12-30 NOTE — ED Provider Notes (Signed)
[facility] Provider Note   CSN: [id number] Arrival date & time: 12/30/16  1825     History   Chief Complaint Chief Complaint  Patient presents with  . Neck Pain  . Arm Pain    HPI Hayden Morris is a 51 y.o. male.  HPI The 51 year old male who comes in today complaining of some neck pain that has been present for the past 2 weeks. He was seen by primary care physician and placed on a prednisone taper as well as Flexeril without relief. He states that the pain has gotten somewhat worse and is radiating into the right trapezius. He has not noted any numbness, tingling, or weakness. Has had no injury. He has not had prior neck problems. 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 Past Medical History:  Diagnosis Date  . Anxiety   . Anxiety   . Hypertension     Patient Active Problem List   Diagnosis Date Noted  . Essential hypertension 11/09/2014    Past Surgical History:  Procedure Laterality Date  . WRIST SURGERY Left        Home Medications    Prior to Admission medications   Medication Sig Start Date End Date Taking? Authorizing Provider  acetaminophen (TYLENOL) 500 MG tablet Take 500-1,000 mg by mouth every 6 (six) hours as needed for headache (pain).   Yes [provider]  allopurinol (ZYLOPRIM) 100 MG tablet Take 100 mg by mouth daily. 12/13/16  Yes [provider]  amLODipine (NORVASC) 2.5 MG tablet Take 2.5 mg by mouth daily. 12/12/16  Yes [provider]  clonazePAM (KLONOPIN) 1 MG tablet Take 1 tablet (1 mg total) by mouth 3 (three) times daily as needed for anxiety. Patient taking differently: Take 1 mg by mouth 2 (two) times daily.  11/15/15  Yes Loren Racer, MD  metoprolol succinate (TOPROL-XL) 25 MG 24 hr tablet Take 25 mg by mouth daily. 12/12/16  Yes [provider]  naproxen sodium (ALEVE) 220 MG tablet Take 220-440 mg by mouth 2 (two) times daily as  needed (pain).   Yes [provider]  cyclobenzaprine (FLEXERIL) 10 MG tablet Take 1 tablet (10 mg total) by mouth 2 (two) times daily as needed for muscle spasms. Patient not taking: Reported on 11/15/2015 11/02/15   Dowless, Lester Kinsman, PA-C  HYDROcodone-acetaminophen (NORCO/VICODIN) 5-325 MG tablet Take 2 tablets by mouth every 4 (four) hours as needed. Patient not taking: Reported on 12/30/2016 10/07/15   Barrett Henle, PA-C  ibuprofen (ADVIL,MOTRIN) 800 MG tablet Take 1 tablet (800 mg total) by mouth every 8 (eight) hours as needed. Patient not taking: Reported on 12/30/2016 07/06/16   Charlestine Night, PA-C  loperamide (IMODIUM) 2 MG capsule Take 1 capsule (2 mg total) by mouth 4 (four) times daily as needed for diarrhea or loose stools. Patient not taking: Reported on 11/15/2015 09/29/15   Barrett Henle, PA-C  ondansetron (ZOFRAN) 4 MG tablet Take 1 tablet (4 mg total) by mouth every 6 (six) hours. Patient not taking: Reported on 12/30/2016 09/28/15   Patel-Mills, Lorelle Formosa, PA-C  oxyCODONE-acetaminophen (PERCOCET/ROXICET) 5-325 MG per tablet Take 1-2 tablets by mouth every 6 (six) hours as needed for severe pain. Patient not taking: Reported on 11/15/2015 01/14/15   Junius Finner, PA-C  traMADol (ULTRAM) 50 MG tablet Take 1 tablet (50 mg total) by mouth every 6 (six) hours as needed for severe pain. Patient not taking: Reported on 12/30/2016  07/06/16   Lawyer, Cristal Deerhristopher, PA-C  valsartan (DIOVAN) 80 MG tablet Take 1 tablet (80 mg total) by mouth daily. Patient not taking: Reported on 11/15/2015 11/07/14   Ambrose FinlandKeck, Valerie A, NP    Family History No family history on file.  Social History Social History  Substance Use Topics  . Smoking status: Never Smoker  . Smokeless tobacco: Never Used  . Alcohol use No     Allergies   Ibuprofen and Lisinopril   Review of Systems Review of Systems  All other systems reviewed and are negative.    Physical Exam Updated  Vital Signs BP (!) 142/99   Pulse 62   Temp 97.8 F (36.6 C) (Oral)   Resp 18   Ht 1.854 m (6\' 1" )   Wt 97.5 kg (215 lb)   SpO2 100%   BMI 28.37 kg/m   Physical Exam  Constitutional: He is oriented to person, place, and time. He appears well-developed and well-nourished.  HENT:  Head: Normocephalic and atraumatic.  Right Ear: External ear normal.  Left Ear: External ear normal.  Nose: Nose normal.  Mouth/Throat: Oropharynx is clear and moist.  Eyes: Conjunctivae and EOM are normal. Pupils are equal, round, and reactive to light.  Neck: Normal range of motion. Neck supple.  Cardiovascular: Normal rate, regular rhythm, normal heart sounds and intact distal pulses.   Pulmonary/Chest: Effort normal and breath sounds normal.  Abdominal: Soft. Bowel sounds are normal.  Musculoskeletal: Normal range of motion.  Neurological: He is alert and oriented to person, place, and time. He has normal reflexes. He displays normal reflexes. No cranial nerve deficit or sensory deficit. He exhibits normal muscle tone. Coordination normal.  Skin: Skin is warm and dry.  Psychiatric: He has a normal mood and affect. His behavior is normal. Judgment and thought content normal.  Nursing note and vitals reviewed.    ED Treatments / Results  Labs (all labs ordered are listed, but only abnormal results are displayed) Labs Reviewed - No data to display  EKG  EKG Interpretation None       Radiology No results found.  Procedures Procedures (including critical care time)  Medications Ordered in ED Medications - No data to display   Initial Impression / Assessment and Plan / ED Course  I have reviewed the triage vital signs and the nursing notes.  Pertinent labs & imaging results that were available during my care of the patient were reviewed by me and considered in my medical decision making (see chart for details).      Final Clinical Impressions(s) / ED Diagnoses   Final diagnoses:    Neck pain    New Prescriptions New Prescriptions   No medications on file     Margarita Grizzleay, Asyia Hornung, MD 12/30/16 2148

## 2016-12-30 NOTE — ED Triage Notes (Signed)
Pt reports R sided neck pain that radiates to R shoulder and down R arm x 2 weeks. Pt states he went to urgent care and was prescribed Prednisone 3 days ago. Pt states it feels like a pinching feeling in his neck and then it starts to ache and he feels tingling in R arm at times. Intermittent, nothing makes it worse or better. Pt denies injury. Denies fevers/chills.

## 2016-12-30 NOTE — Discharge Instructions (Signed)
Continue ibuprofen and flexeril.  Call Dr. Lazaro ArmsHudnall's office Monday for follow up.

## 2017-09-30 IMAGING — DX DG KNEE COMPLETE 4+V*R*
4 series · 4 of 4 positions shown · non-contrast
Comparison: None.

CLINICAL DATA: Right knee pain following bending at work, no known
injury, initial encounter

EXAM:
RIGHT KNEE - COMPLETE 4+ VIEW

[knee ap]
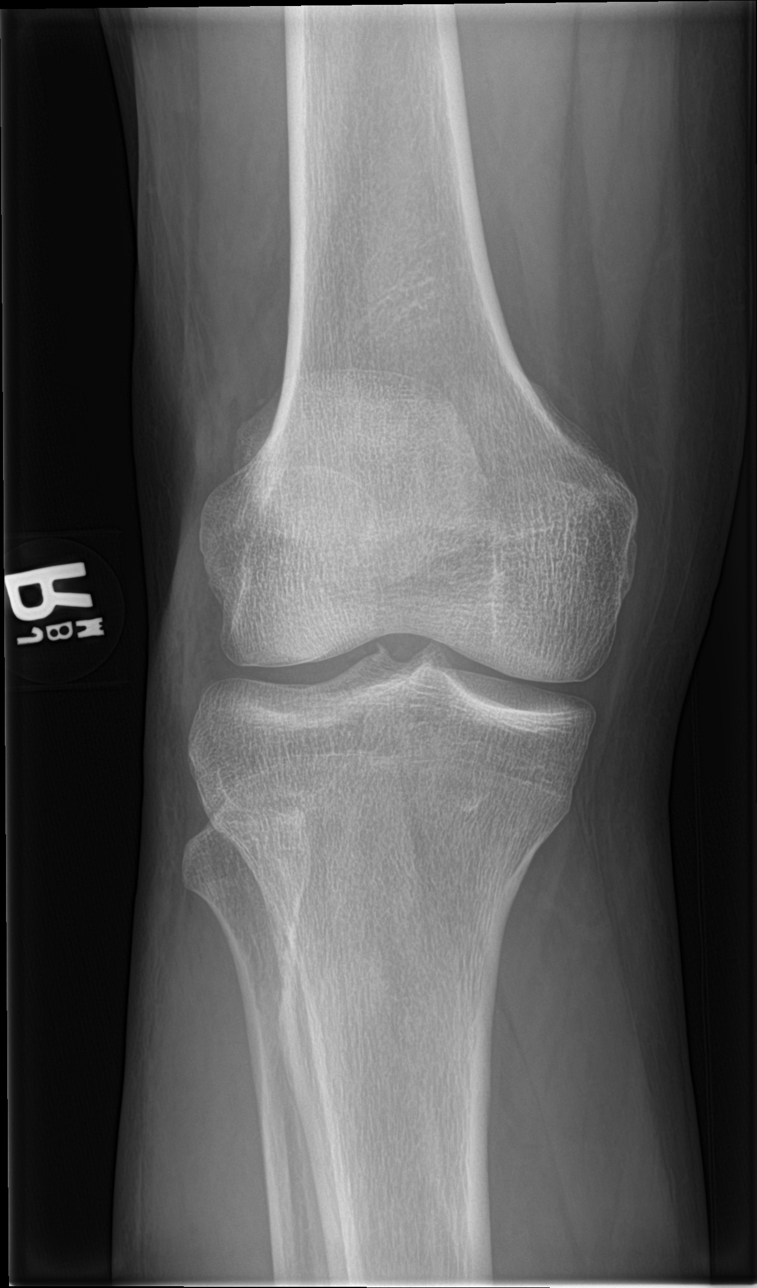

[tunnel]
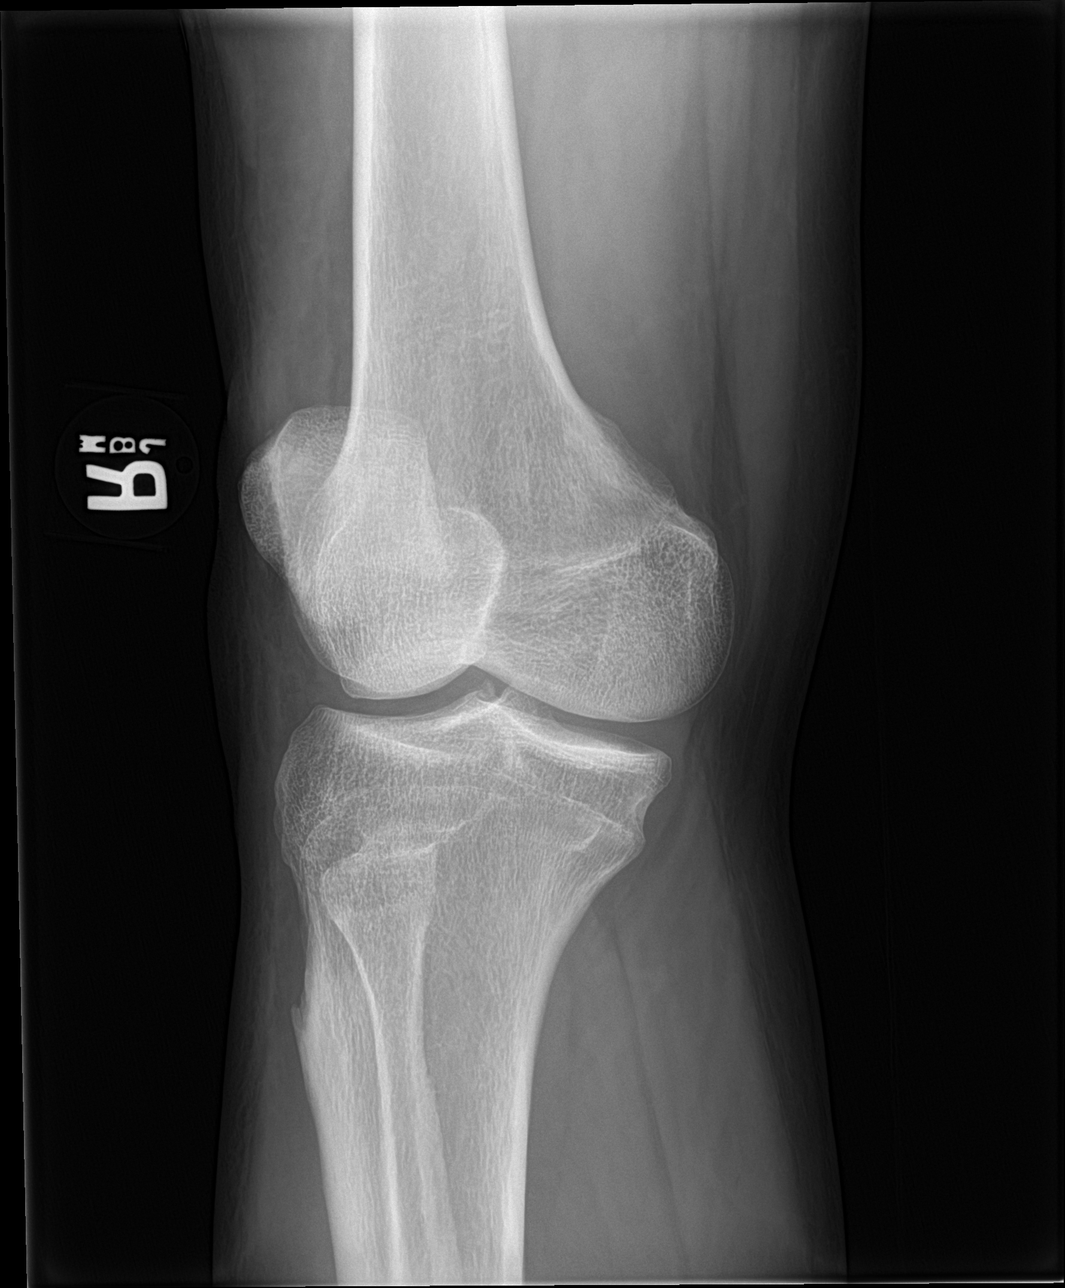

[knee lat]
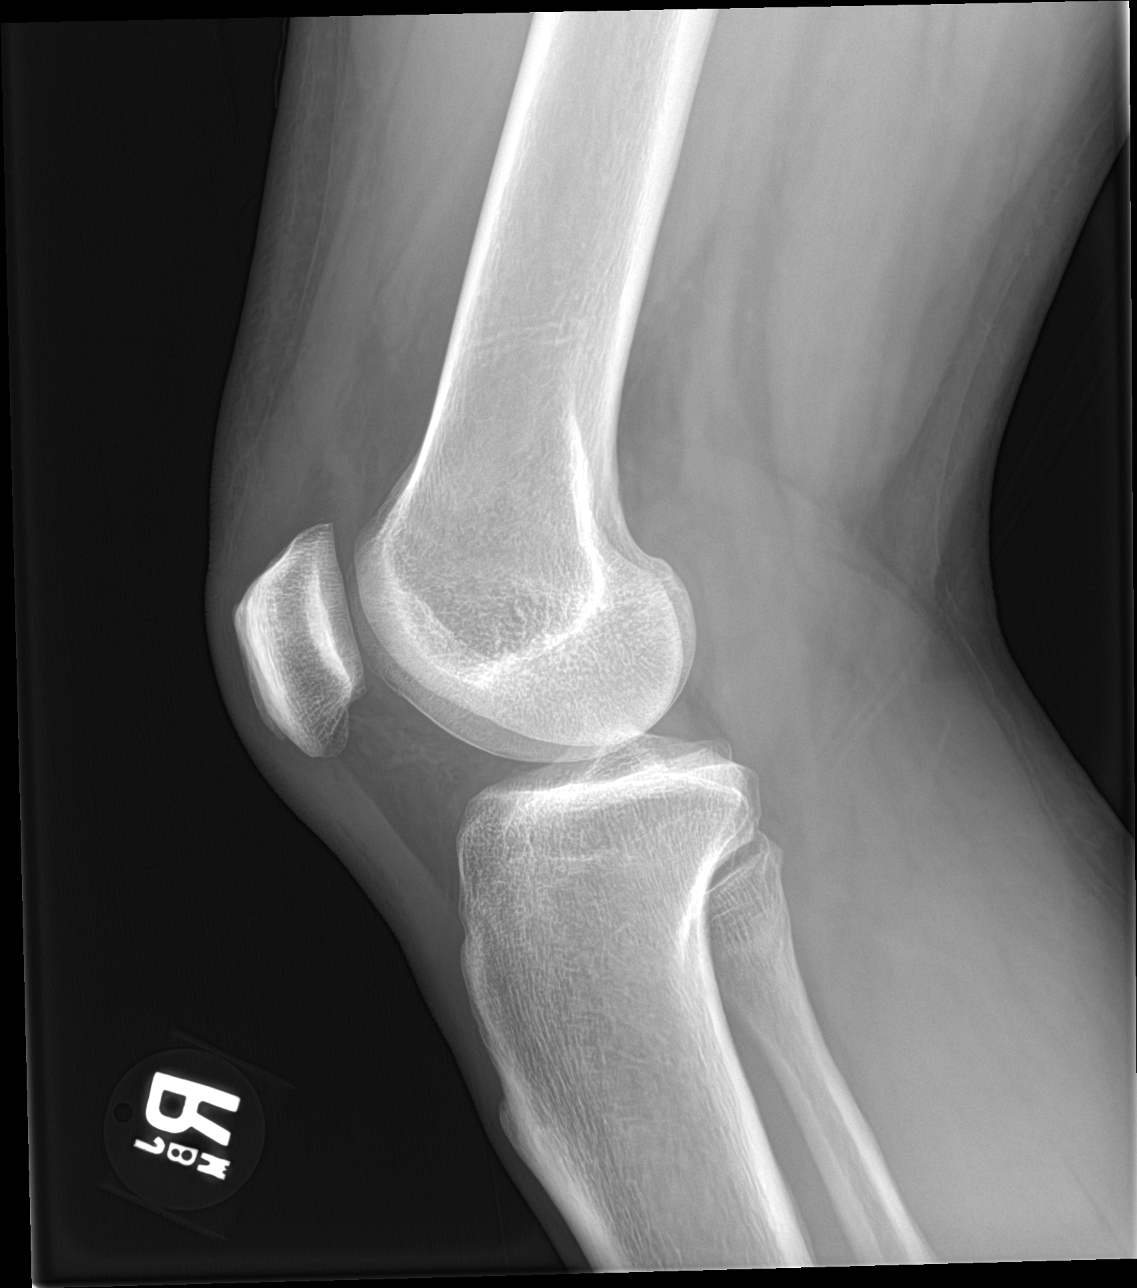

[knee obl]
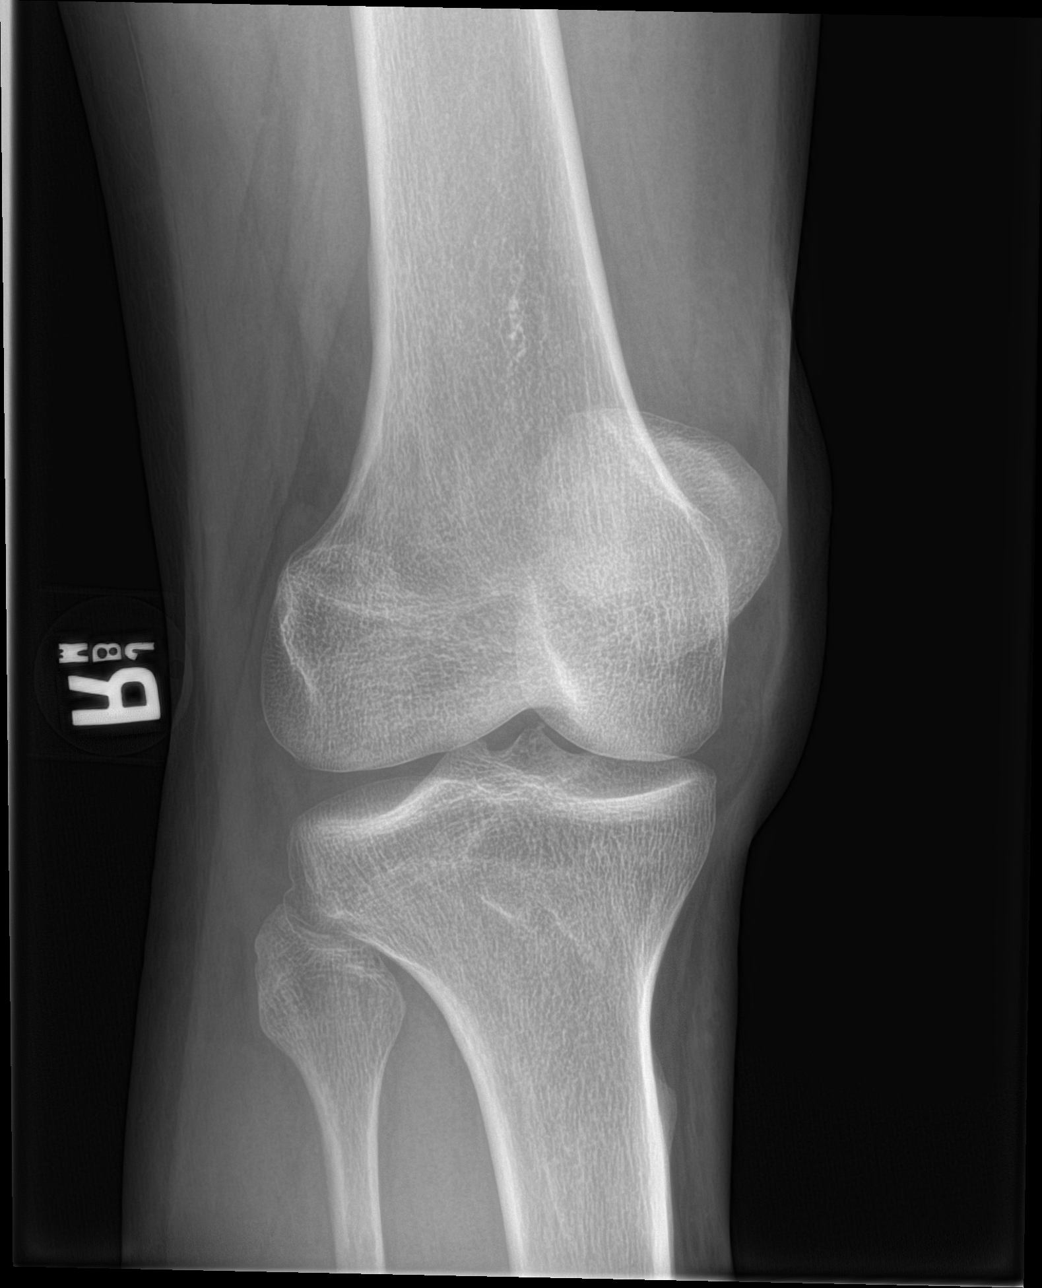

[4 of 4 positions shown; findings below may reference images not displayed]

FINDINGS: There is no evidence of fracture, dislocation, or joint effusion.
There is no evidence of arthropathy or other focal bone abnormality.
Soft tissues are unremarkable.
IMPRESSION: No acute abnormality noted.

## 2019-05-26 ENCOUNTER — Emergency Department (HOSPITAL_COMMUNITY)
Admission: EM | Admit: 2019-05-26 | Discharge: 2019-05-26 | Disposition: A | Discharge status: Home / Self Care | Payer: 59 | Attending: Emergency Medicine | Admitting: Emergency Medicine

## 2019-05-26 ENCOUNTER — Encounter (HOSPITAL_COMMUNITY): Payer: Self-pay

## 2019-05-26 ENCOUNTER — Other Ambulatory Visit: Payer: Self-pay

## 2019-05-26 DIAGNOSIS — Y92810 Car as the place of occurrence of the external cause: Secondary | ICD-10-CM | POA: Insufficient documentation

## 2019-05-26 DIAGNOSIS — Y999 Unspecified external cause status: Secondary | ICD-10-CM | POA: Diagnosis not present

## 2019-05-26 DIAGNOSIS — M542 Cervicalgia: Secondary | ICD-10-CM | POA: Diagnosis present

## 2019-05-26 DIAGNOSIS — Y9389 Activity, other specified: Secondary | ICD-10-CM | POA: Diagnosis not present

## 2019-05-26 DIAGNOSIS — R202 Paresthesia of skin: Secondary | ICD-10-CM | POA: Insufficient documentation

## 2019-05-26 DIAGNOSIS — I10 Essential (primary) hypertension: Secondary | ICD-10-CM | POA: Diagnosis not present

## 2019-05-26 DIAGNOSIS — Z79899 Other long term (current) drug therapy: Secondary | ICD-10-CM | POA: Insufficient documentation

## 2019-05-26 DIAGNOSIS — W2209XA Striking against other stationary object, initial encounter: Secondary | ICD-10-CM | POA: Diagnosis not present

## 2019-05-26 MED ORDER — LIDOCAINE 5 % EX PTCH: 1.0000 | MEDICATED_PATCH | CUTANEOUS | 0 refills | Status: AC

## 2019-05-26 MED ORDER — METHOCARBAMOL 500 MG PO TABS: 500.0000 mg | ORAL_TABLET | Freq: Two times a day (BID) | ORAL | 0 refills | Status: AC

## 2019-05-26 NOTE — ED Provider Notes (Addendum)
[facility] DEPT Provider Note   CSN: [id number] Arrival date & time: 05/26/19  1203     History   Chief Complaint Chief Complaint  Patient presents with   Neck Pain    HPI Clee Pandit is a 53 y.o. male with no history who presents for evaluation of neck pain.  Patient states he was getting out of the car when he hit the right side of his lower head, posterior neck on a car door.  Patient denies LOC or anticoagulation.  Patient states he has had persistent pain to this area since the incident.  He tried taking Tylenol and Aleve with mild relief of his pain.  He rates his current pain a 4/10.  He admits to prior history of anterior and posterior cervical fusion.  Patient states he does have some decreased sensation to his right fingers however states this is been persistent since his cervical fusion surgeries and has not changed.  He denies any unilateral weakness, sudden onset thunderclap headache, blurred vision, neck rigidity, fever, chills, dizziness, illness, chest pain, shortness of breath, hemoptysis, decreased range of motion to extremities, redness, swelling, warmth to extremities.  Denies additional rating or alleviating factors.  History obtained from patient and past medical records.  No interpretor was used.     HPI  Past Medical History:  Diagnosis Date   Anxiety    Anxiety    Hypertension     Patient Active Problem List   Diagnosis Date Noted   Essential hypertension 11/09/2014    Past Surgical History:  Procedure Laterality Date   WRIST SURGERY Left         Home Medications    Prior to Admission medications   Medication Sig Start Date End Date Taking? Authorizing Provider  acetaminophen (TYLENOL) 500 MG tablet Take 500-1,000 mg by mouth every 6 (six) hours as needed for headache (pain).    [provider]  allopurinol (ZYLOPRIM) 100 MG tablet Take 100 mg by mouth daily. 12/13/16   [provider]  amLODipine (NORVASC) 2.5 MG tablet Take 2.5 mg by mouth daily. 12/12/16   [provider]  clonazePAM (KLONOPIN) 1 MG tablet Take 1 tablet (1 mg total) by mouth 3 (three) times daily as needed for anxiety. Patient taking differently: Take 1 mg by mouth 2 (two) times daily.  11/15/15   Julianne Rice, MD  cyclobenzaprine (FLEXERIL) 10 MG tablet Take 1 tablet (10 mg total) by mouth 2 (two) times daily as needed for muscle spasms. Patient not taking: Reported on 11/15/2015 11/02/15   Dowless, Dondra Spry, PA-C  HYDROcodone-acetaminophen (NORCO/VICODIN) 5-325 MG tablet Take 2 tablets by mouth every 4 (four) hours as needed. Patient not taking: Reported on 12/30/2016 10/07/15   Nona Dell, PA-C  ibuprofen (ADVIL,MOTRIN) 800 MG tablet Take 1 tablet (800 mg total) by mouth every 8 (eight) hours as needed. Patient not taking: Reported on 12/30/2016 07/06/16   Dalia Heading, PA-C  lidocaine (LIDODERM) 5 % Place 1 patch onto the skin daily. Remove & Discard patch within 12 hours or as directed by MD 05/26/19   Yoneko Talerico A, PA-C  loperamide (IMODIUM) 2 MG capsule Take 1 capsule (2 mg total) by mouth 4 (four) times daily as needed for diarrhea or loose stools. Patient not taking: Reported on 11/15/2015 09/29/15   Nona Dell, PA-C  methocarbamol (ROBAXIN) 500 MG tablet Take 1 tablet (500 mg total) by mouth 2 (two) times daily. 05/26/19   Remee Charley  A, PA-C  metoprolol succinate (TOPROL-XL) 25 MG 24 hr tablet Take 25 mg by mouth daily. 12/12/16   [provider]  naproxen sodium (ALEVE) 220 MG tablet Take 220-440 mg by mouth 2 (two) times daily as needed (pain).    [provider]  ondansetron (ZOFRAN) 4 MG tablet Take 1 tablet (4 mg total) by mouth every 6 (six) hours. Patient not taking: Reported on 12/30/2016 09/28/15   Patel-Mills, Lorelle Formosa, PA-C  oxyCODONE-acetaminophen (PERCOCET/ROXICET) 5-325 MG per tablet Take 1-2 tablets by mouth  every 6 (six) hours as needed for severe pain. Patient not taking: Reported on 11/15/2015 01/14/15   Lurene Shadow, PA-C  traMADol (ULTRAM) 50 MG tablet Take 1 tablet (50 mg total) by mouth every 6 (six) hours as needed for severe pain. Patient not taking: Reported on 12/30/2016 07/06/16   Charlestine Night, PA-C  valsartan (DIOVAN) 80 MG tablet Take 1 tablet (80 mg total) by mouth daily. Patient not taking: Reported on 11/15/2015 11/07/14   Ambrose Finland, NP    Family History No family history on file.  Social History Social History   Tobacco Use   Smoking status: Never Smoker   Smokeless tobacco: Never Used  Substance Use Topics   Alcohol use: No   Drug use: No     Allergies   Ibuprofen and Lisinopril   Review of Systems Review of Systems  Constitutional: Negative.   HENT: Negative.   Respiratory: Negative.   Cardiovascular: Negative.   Gastrointestinal: Negative.   Genitourinary: Negative.   Musculoskeletal: Positive for neck pain. Negative for arthralgias, back pain, gait problem, joint swelling, myalgias and neck stiffness.  Skin: Negative.   Neurological: Positive for numbness. 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  All other systems reviewed and are negative.    Physical Exam Updated Vital Signs BP (!) 128/95    Pulse 67    Temp 99.3 F (37.4 C) (Oral)    Resp 16    Wt 98.9 kg    SpO2 100%    BMI 28.76 kg/m   Physical Exam Vitals signs and nursing note reviewed.  Constitutional:      General: He is not in acute distress.    Appearance: He is well-developed. He is not ill-appearing, toxic-appearing or diaphoretic.  HENT:     Head: Normocephalic and atraumatic.     Jaw: There is normal jaw occlusion.     Right Ear: Hearing, tympanic membrane and external ear normal. No drainage, swelling or tenderness. No middle  ear effusion. Tympanic membrane is not injected, scarred, perforated, erythematous, retracted or bulging.     Left Ear: Hearing, tympanic membrane and external ear normal. No drainage, swelling or tenderness.  No middle ear effusion. Tympanic membrane is not injected, scarred, perforated, erythematous, retracted or bulging.     Nose: Nose normal.  Eyes:     General: Lids are normal.     Extraocular Movements: Extraocular movements intact.     Pupils: Pupils are equal, round, and reactive to light.     Comments: No horizontal, vertical or rotational nystagmus   Neck:     Musculoskeletal: Full passive range of motion without pain, normal range of motion and neck supple. Normal range of motion. Muscular tenderness present. No edema, erythema, neck rigidity, crepitus, injury, pain with movement, torticollis or spinous process tenderness.     Trachea: Trachea and phonation normal.  Comments: Full active and passive ROM without pain No midline or paraspinal tenderness No nuchal rigidity or meningeal signs  Cardiovascular:     Rate and Rhythm: Normal rate and regular rhythm.     Pulses: Normal pulses.     Heart sounds: Normal heart sounds.  Pulmonary:     Effort: Pulmonary effort is normal. No respiratory distress.     Breath sounds: Normal breath sounds and air entry.  Abdominal:     General: Bowel sounds are normal. There is no distension.     Palpations: Abdomen is soft.     Tenderness: There is no abdominal tenderness.  Musculoskeletal: Normal range of motion.  Lymphadenopathy:     Cervical: No cervical adenopathy.  Skin:    General: Skin is warm and dry.  Neurological:     Mental Status: He is alert.     Comments: Mental Status:  Alert, oriented, thought content appropriate. Speech fluent without evidence of aphasia. Able to follow 2 step commands without difficulty.  Cranial Nerves:  II:  Peripheral visual fields grossly normal, pupils equal, round, reactive to light III,IV,  VI: ptosis not present, extra-ocular motions intact bilaterally  V,VII: smile symmetric, facial light touch sensation equal VIII: hearing grossly normal bilaterally  IX,X: midline uvula rise  XI: bilateral shoulder shrug equal and strong XII: midline tongue extension  Motor:  5/5 in upper and lower extremities bilaterally including strong and equal grip strength and dorsiflexion/plantar flexion Sensory: Pinprick and light touch normal in all extremities.  Deep Tendon Reflexes: 2+ and symmetric  Cerebellar: normal finger-to-nose with bilateral upper extremities Gait: normal gait and balance CV: distal pulses palpable throughout      ED Treatments / Results  Labs (all labs ordered are listed, but only abnormal results are displayed) Labs Reviewed - No data to display  EKG None  Radiology No results found.  Procedures Procedures (including critical care time)  Medications Ordered in ED Medications - No data to display  Initial Impression / Assessment and Plan / ED Course  I have reviewed the triage vital signs and the nursing notes.  Pertinent labs & imaging results that were available during my care of the patient were reviewed by me and considered in my medical decision making (see chart for details).  53 year old who appears otherwise well presents for evaluation of neck pain after hitting this on his car door earlier today.  He is afebrile, nonseptic, non-ill-appearing.  He has normal musculoskeletal exam however he does have trapezius pain to the right side with palpable spasm.  He has a nonfocal neurologic exam without deficits.  Tolerating p.o. intake without difficulty.  No vision changes, dizziness, headache.  Likely musculoskeletal pain.  He has no midline tenderness.  He does have some old surgical scars from anterior and posterior cervical fusion.  I have low suspicion for acute tendon, ligament, vascular, intracranial pathology. Low suspicion for hardware loosening or  acute fracture of hardware given reassuring exam.  No meningismus.  Will treat conservatively with anti-inflammatories, muscle relaxers.  Discussed return precautions.  Patient voiced understanding and is agreeable for follow-up.   The patient has been appropriately medically screened and/or stabilized in the ED. I have low suspicion for any other emergent medical condition which would require further screening, evaluation or treatment in the ED or require inpatient management.  Patient is hemodynamically stable and in no acute distress.  Patient able to ambulate in department prior to ED.  Evaluation does not show acute pathology  that would require ongoing or additional emergent interventions while in the emergency department or further inpatient treatment.  I have discussed the diagnosis with the patient and answered all questions.  Pain is been managed while in the emergency department and patient has no further complaints prior to discharge.  Patient is comfortable with plan discussed in room and is stable for discharge at this time.  I have discussed strict return precautions for returning to the emergency department.  Patient was encouraged to follow-up with PCP/specialist refer to at discharge.       Final Clinical Impressions(s) / ED Diagnoses   Final diagnoses:  Neck pain    ED Discharge Orders         Ordered    methocarbamol (ROBAXIN) 500 MG tablet  2 times daily     05/26/19 1539    lidocaine (LIDODERM) 5 %  Every 24 hours     05/26/19 1539           Korbin Notaro A, PA-C 05/26/19 1544    Perfecto Purdy A, PA-C 05/26/19 1545    Terrilee FilesButler, Michael C, MD 05/27/19 1112

## 2019-05-26 NOTE — Discharge Instructions (Signed)
He may take Tylenol and ibuprofen as needed for pain.  Do not take more than 4000 mg Tylenol in 2400 mg ibuprofen daily.  I have also prescribed you lidocaine patches.  You may place to your neck.  You keep on for 12 hours and remove for 12 hours.  You must be patch free for 12 hours before you is an additional patch.  If you develop severe thunderclap headache, dizziness, passing out, throwing up 3 times in 24 hours, one-sided weakness you need to seek reevaluation the emergency department.  Otherwise please follow-up with your neurosurgeon.

## 2019-05-26 NOTE — ED Triage Notes (Signed)
Pt states that he is having neck pain since he hit it getting out of the car today, mostly on the back right side.

## 2019-10-10 ENCOUNTER — Ambulatory Visit: Payer: 59 | Attending: Internal Medicine

## 2019-10-10 DIAGNOSIS — Z23 Encounter for immunization: Secondary | ICD-10-CM

## 2019-10-10 NOTE — Progress Notes (Signed)
   Covid-19 Vaccination Clinic  Name:  Admir Candelas    MRN: [id number] DOB: 1965/10/26  10/10/2019  Mr. Grace was observed post Covid-19 immunization for 15 minutes without incident. He was provided with Vaccine Information Sheet and instruction to access the V-Safe system.   Mr. Coldren was instructed to call 911 with any severe reactions post vaccine: Marland Kitchen Difficulty breathing  . Swelling of face and throat  . A fast heartbeat  . A bad rash all over body  . Dizziness and weakness   Immunizations Administered    Name Date Dose VIS Date Route   Pfizer COVID-19 Vaccine 10/10/2019  8:11 AM 0.3 mL 07/05/2019 Intramuscular   Manufacturer: [employer], [employer]   Lot: [id number]   NDC: [address]-9

## 2019-11-04 ENCOUNTER — Ambulatory Visit: Payer: 59

## 2019-11-08 ENCOUNTER — Ambulatory Visit: Payer: 59 | Attending: Internal Medicine

## 2019-11-08 DIAGNOSIS — Z23 Encounter for immunization: Secondary | ICD-10-CM

## 2019-11-08 NOTE — Progress Notes (Signed)
   Covid-19 Vaccination Clinic  Name:  Hayden Morris    MRN: [id number] DOB: 04-Apr-1966  11/08/2019  Hayden Morris was observed post Covid-19 immunization for 15 minutes without incident. He was provided with Vaccine Information Sheet and instruction to access the V-Safe system.   Hayden Morris was instructed to call 911 with any severe reactions post vaccine: Marland Kitchen Difficulty breathing  . Swelling of face and throat  . A fast heartbeat  . A bad rash all over body  . Dizziness and weakness   Immunizations Administered    Name Date Dose VIS Date Route   Pfizer COVID-19 Vaccine 11/08/2019  8:10 AM 0.3 mL 07/05/2019 Intramuscular   Manufacturer: [employer], [employer]   Lot: [id number]   NDC: [address]-1
